# Patient Record
Sex: Female | Born: 1935 | Race: White | Hispanic: No | Smoking: Former smoker
Health system: Southern US, Community
[De-identification: ages and names within clinical notes are randomized; demographics above are authoritative.]

## PROBLEM LIST (undated history)

## (undated) DIAGNOSIS — S069X9A Unspecified intracranial injury with loss of consciousness of unspecified duration, initial encounter: Secondary | ICD-10-CM

## (undated) DIAGNOSIS — Z9289 Personal history of other medical treatment: Secondary | ICD-10-CM

## (undated) DIAGNOSIS — E7800 Pure hypercholesterolemia, unspecified: Secondary | ICD-10-CM

## (undated) DIAGNOSIS — F419 Anxiety disorder, unspecified: Secondary | ICD-10-CM

## (undated) DIAGNOSIS — E119 Type 2 diabetes mellitus without complications: Secondary | ICD-10-CM

## (undated) DIAGNOSIS — R011 Cardiac murmur, unspecified: Secondary | ICD-10-CM

## (undated) DIAGNOSIS — D649 Anemia, unspecified: Secondary | ICD-10-CM

## (undated) DIAGNOSIS — I10 Essential (primary) hypertension: Secondary | ICD-10-CM

## (undated) HISTORY — PX: TONSILLECTOMY: SUR1361

## (undated) HISTORY — PX: FRACTURE SURGERY: SHX138

## (undated) HISTORY — PX: APPENDECTOMY: SHX54

## (undated) HISTORY — PX: CHOLECYSTECTOMY: SHX55

## (undated) HISTORY — PX: TUBAL LIGATION: SHX77

---

## 2006-02-22 HISTORY — PX: HIP FRACTURE SURGERY: SHX118

## 2013-12-17 ENCOUNTER — Inpatient Hospital Stay (HOSPITAL_COMMUNITY): Payer: Medicare Other

## 2013-12-17 ENCOUNTER — Encounter (HOSPITAL_COMMUNITY): Payer: Self-pay

## 2013-12-17 ENCOUNTER — Emergency Department (HOSPITAL_COMMUNITY): Payer: Medicare Other

## 2013-12-17 ENCOUNTER — Inpatient Hospital Stay (HOSPITAL_COMMUNITY)
Admission: EM | Admit: 2013-12-17 | Discharge: 2013-12-23 | DRG: 958 | Disposition: A | Discharge status: Skilled Nursing Facility | Payer: Medicare Other | Attending: General Surgery | Admitting: General Surgery

## 2013-12-17 DIAGNOSIS — E119 Type 2 diabetes mellitus without complications: Secondary | ICD-10-CM | POA: Diagnosis present

## 2013-12-17 DIAGNOSIS — S32471A Displaced fracture of medial wall of right acetabulum, initial encounter for closed fracture: Secondary | ICD-10-CM | POA: Diagnosis present

## 2013-12-17 DIAGNOSIS — S2242XA Multiple fractures of ribs, left side, initial encounter for closed fracture: Secondary | ICD-10-CM | POA: Diagnosis present

## 2013-12-17 DIAGNOSIS — I10 Essential (primary) hypertension: Secondary | ICD-10-CM | POA: Diagnosis present

## 2013-12-17 DIAGNOSIS — S2239XA Fracture of one rib, unspecified side, initial encounter for closed fracture: Secondary | ICD-10-CM

## 2013-12-17 DIAGNOSIS — I350 Nonrheumatic aortic (valve) stenosis: Secondary | ICD-10-CM

## 2013-12-17 DIAGNOSIS — S069X9A Unspecified intracranial injury with loss of consciousness of unspecified duration, initial encounter: Secondary | ICD-10-CM | POA: Diagnosis present

## 2013-12-17 DIAGNOSIS — W2212XA Striking against or struck by front passenger side automobile airbag, initial encounter: Secondary | ICD-10-CM | POA: Diagnosis present

## 2013-12-17 DIAGNOSIS — S069XAA Unspecified intracranial injury with loss of consciousness status unknown, initial encounter: Secondary | ICD-10-CM | POA: Diagnosis present

## 2013-12-17 DIAGNOSIS — S32409A Unspecified fracture of unspecified acetabulum, initial encounter for closed fracture: Secondary | ICD-10-CM

## 2013-12-17 DIAGNOSIS — D62 Acute posthemorrhagic anemia: Secondary | ICD-10-CM | POA: Diagnosis not present

## 2013-12-17 DIAGNOSIS — S32401A Unspecified fracture of right acetabulum, initial encounter for closed fracture: Secondary | ICD-10-CM | POA: Diagnosis present

## 2013-12-17 DIAGNOSIS — E785 Hyperlipidemia, unspecified: Secondary | ICD-10-CM | POA: Diagnosis present

## 2013-12-17 DIAGNOSIS — Z7982 Long term (current) use of aspirin: Secondary | ICD-10-CM | POA: Diagnosis not present

## 2013-12-17 DIAGNOSIS — S066X9A Traumatic subarachnoid hemorrhage with loss of consciousness of unspecified duration, initial encounter: Secondary | ICD-10-CM | POA: Diagnosis present

## 2013-12-17 DIAGNOSIS — T1490 Injury, unspecified: Secondary | ICD-10-CM | POA: Diagnosis present

## 2013-12-17 DIAGNOSIS — T1490XA Injury, unspecified, initial encounter: Secondary | ICD-10-CM

## 2013-12-17 HISTORY — DX: Essential (primary) hypertension: I10

## 2013-12-17 HISTORY — DX: Unspecified intracranial injury with loss of consciousness status unknown, initial encounter: S06.9XAA

## 2013-12-17 HISTORY — DX: Unspecified intracranial injury with loss of consciousness of unspecified duration, initial encounter: S06.9X9A

## 2013-12-17 LAB — URINALYSIS, ROUTINE W REFLEX MICROSCOPIC
Bilirubin Urine: NEGATIVE
GLUCOSE, UA: NEGATIVE mg/dL
KETONES UR: 15 mg/dL — AB
NITRITE: NEGATIVE
PROTEIN: 30 mg/dL — AB
Specific Gravity, Urine: 1.044 — ABNORMAL HIGH (ref 1.005–1.030)
Urobilinogen, UA: 0.2 mg/dL (ref 0.0–1.0)
pH: 6.5 (ref 5.0–8.0)

## 2013-12-17 LAB — COMPREHENSIVE METABOLIC PANEL
ALBUMIN: 3.6 g/dL (ref 3.5–5.2)
ALT: 20 U/L (ref 0–35)
ANION GAP: 14 (ref 5–15)
AST: 29 U/L (ref 0–37)
Alkaline Phosphatase: 61 U/L (ref 39–117)
BILIRUBIN TOTAL: 0.3 mg/dL (ref 0.3–1.2)
BUN: 17 mg/dL (ref 6–23)
CHLORIDE: 101 meq/L (ref 96–112)
CO2: 24 mEq/L (ref 19–32)
Calcium: 9.6 mg/dL (ref 8.4–10.5)
Creatinine, Ser: 0.89 mg/dL (ref 0.50–1.10)
GFR calc Af Amer: 70 mL/min — ABNORMAL LOW (ref >90)
GFR calc non Af Amer: 60 mL/min — ABNORMAL LOW (ref >90)
Glucose, Bld: 155 mg/dL — ABNORMAL HIGH (ref 70–99)
POTASSIUM: 4.2 meq/L (ref 3.7–5.3)
Sodium: 139 mEq/L (ref 137–147)
TOTAL PROTEIN: 6.3 g/dL (ref 6.0–8.3)

## 2013-12-17 LAB — CBC
HEMATOCRIT: 30.8 % — AB (ref 36.0–46.0)
HEMOGLOBIN: 10.1 g/dL — AB (ref 12.0–15.0)
MCH: 32.4 pg (ref 26.0–34.0)
MCHC: 32.8 g/dL (ref 30.0–36.0)
MCV: 98.7 fL (ref 78.0–100.0)
Platelets: 182 10*3/uL (ref 150–400)
RBC: 3.12 MIL/uL — ABNORMAL LOW (ref 3.87–5.11)
RDW: 13.9 % (ref 11.5–15.5)
WBC: 8.2 10*3/uL (ref 4.0–10.5)

## 2013-12-17 LAB — I-STAT CHEM 8, ED
BUN: 16 mg/dL (ref 6–23)
CALCIUM ION: 1.23 mmol/L (ref 1.13–1.30)
Chloride: 103 mEq/L (ref 96–112)
Creatinine, Ser: 0.9 mg/dL (ref 0.50–1.10)
Glucose, Bld: 155 mg/dL — ABNORMAL HIGH (ref 70–99)
HEMATOCRIT: 30 % — AB (ref 36.0–46.0)
HEMOGLOBIN: 10.2 g/dL — AB (ref 12.0–15.0)
Potassium: 4 mEq/L (ref 3.7–5.3)
Sodium: 141 mEq/L (ref 137–147)
TCO2: 24 mmol/L (ref 0–100)

## 2013-12-17 LAB — HEMOGLOBIN AND HEMATOCRIT, BLOOD
HCT: 25.6 % — ABNORMAL LOW (ref 36.0–46.0)
Hemoglobin: 8.3 g/dL — ABNORMAL LOW (ref 12.0–15.0)

## 2013-12-17 LAB — GLUCOSE, CAPILLARY: Glucose-Capillary: 229 mg/dL — ABNORMAL HIGH (ref 70–99)

## 2013-12-17 LAB — URINE MICROSCOPIC-ADD ON

## 2013-12-17 LAB — I-STAT CG4 LACTIC ACID, ED: LACTIC ACID, VENOUS: 1.1 mmol/L (ref 0.5–2.2)

## 2013-12-17 LAB — PROTIME-INR
INR: 1.12 (ref 0.00–1.49)
PROTHROMBIN TIME: 14.6 s (ref 11.6–15.2)

## 2013-12-17 LAB — SAMPLE TO BLOOD BANK

## 2013-12-17 LAB — MRSA PCR SCREENING: MRSA BY PCR: NEGATIVE

## 2013-12-17 LAB — ETHANOL: Alcohol, Ethyl (B): <11 mg/dL (ref 0–11)

## 2013-12-17 MED ORDER — INSULIN ASPART 100 UNIT/ML ~~LOC~~ SOLN
0.0000 [IU] | Freq: Three times a day (TID) | SUBCUTANEOUS | Status: DC
  Administered 2013-12-18: 2 [IU] via SUBCUTANEOUS
  Administered 2013-12-19: 3 [IU] via SUBCUTANEOUS
  Administered 2013-12-19: 2 [IU] via SUBCUTANEOUS
  Administered 2013-12-20 (×2): 3 [IU] via SUBCUTANEOUS
  Administered 2013-12-20: 2 [IU] via SUBCUTANEOUS
  Administered 2013-12-21: 3 [IU] via SUBCUTANEOUS
  Administered 2013-12-21 – 2013-12-22 (×2): 2 [IU] via SUBCUTANEOUS

## 2013-12-17 MED ORDER — MORPHINE SULFATE 2 MG/ML IJ SOLN: 2.0000 mg | INTRAMUSCULAR | Status: DC | PRN

## 2013-12-17 MED ORDER — BACITRACIN ZINC 500 UNIT/GM EX OINT
TOPICAL_OINTMENT | Freq: Two times a day (BID) | CUTANEOUS | Status: DC
  Administered 2013-12-17 – 2013-12-19 (×5): via TOPICAL
  Administered 2013-12-20 (×2): 1 via TOPICAL
  Administered 2013-12-21 – 2013-12-23 (×5): 15.5556 via TOPICAL
  Filled 2013-12-17: qty 28.35
  Filled 2013-12-17: qty 15
  Filled 2013-12-17: qty 28.35

## 2013-12-17 MED ORDER — GABAPENTIN 300 MG PO CAPS
300.0000 mg | ORAL_CAPSULE | Freq: Every day | ORAL | Status: DC
  Administered 2013-12-17 – 2013-12-22 (×6): 300 mg via ORAL
  Filled 2013-12-17 (×9): qty 1

## 2013-12-17 MED ORDER — POLYETHYLENE GLYCOL 3350 17 G PO PACK
17.0000 g | PACK | Freq: Every day | ORAL | Status: DC
  Administered 2013-12-19 – 2013-12-23 (×5): 17 g via ORAL
  Filled 2013-12-17 (×7): qty 1

## 2013-12-17 MED ORDER — HYDROCODONE-ACETAMINOPHEN 5-325 MG PO TABS
1.0000 | ORAL_TABLET | ORAL | Status: DC | PRN
  Administered 2013-12-17: 1 via ORAL
  Administered 2013-12-18 – 2013-12-20 (×3): 2 via ORAL
  Filled 2013-12-17: qty 2
  Filled 2013-12-17: qty 1
  Filled 2013-12-17 (×3): qty 2

## 2013-12-17 MED ORDER — ONDANSETRON HCL 4 MG/2ML IJ SOLN
4.0000 mg | Freq: Four times a day (QID) | INTRAMUSCULAR | Status: DC | PRN
  Administered 2013-12-17: 4 mg via INTRAVENOUS
  Filled 2013-12-17: qty 2

## 2013-12-17 MED ORDER — LOSARTAN POTASSIUM 25 MG PO TABS
25.0000 mg | ORAL_TABLET | Freq: Two times a day (BID) | ORAL | Status: DC
  Administered 2013-12-17 – 2013-12-23 (×11): 25 mg via ORAL
  Filled 2013-12-17 (×15): qty 1

## 2013-12-17 MED ORDER — FENTANYL CITRATE 0.05 MG/ML IJ SOLN
50.0000 ug | Freq: Once | INTRAMUSCULAR | Status: AC
  Administered 2013-12-17: 50 ug via INTRAVENOUS
  Filled 2013-12-17: qty 2

## 2013-12-17 MED ORDER — TRAMADOL HCL 50 MG PO TABS
50.0000 mg | ORAL_TABLET | Freq: Four times a day (QID) | ORAL | Status: DC | PRN
  Administered 2013-12-17 – 2013-12-19 (×3): 100 mg via ORAL
  Filled 2013-12-17 (×3): qty 2

## 2013-12-17 MED ORDER — SIMVASTATIN 40 MG PO TABS
40.0000 mg | ORAL_TABLET | Freq: Every day | ORAL | Status: DC
  Administered 2013-12-19 – 2013-12-23 (×5): 40 mg via ORAL
  Filled 2013-12-17 (×6): qty 1

## 2013-12-17 MED ORDER — PANTOPRAZOLE SODIUM 40 MG PO TBEC
40.0000 mg | DELAYED_RELEASE_TABLET | Freq: Every day | ORAL | Status: DC
  Administered 2013-12-17 – 2013-12-19 (×2): 40 mg via ORAL
  Filled 2013-12-17 (×2): qty 1

## 2013-12-17 MED ORDER — ONDANSETRON HCL 4 MG PO TABS: 4.0000 mg | ORAL_TABLET | Freq: Four times a day (QID) | ORAL | Status: DC | PRN

## 2013-12-17 MED ORDER — SODIUM CHLORIDE 0.9 % IV BOLUS (SEPSIS)
500.0000 mL | Freq: Once | INTRAVENOUS | Status: AC
  Administered 2013-12-17: 500 mL via INTRAVENOUS

## 2013-12-17 MED ORDER — FENTANYL CITRATE 0.05 MG/ML IJ SOLN
25.0000 ug | INTRAMUSCULAR | Status: DC | PRN
  Administered 2013-12-17 – 2013-12-18 (×2): 50 ug via INTRAVENOUS
  Administered 2013-12-18: 25 ug via INTRAVENOUS
  Administered 2013-12-19 (×2): 50 ug via INTRAVENOUS
  Filled 2013-12-17 (×5): qty 2

## 2013-12-17 MED ORDER — PANTOPRAZOLE SODIUM 40 MG IV SOLR
40.0000 mg | Freq: Every day | INTRAVENOUS | Status: DC
  Administered 2013-12-18: 40 mg via INTRAVENOUS
  Filled 2013-12-17 (×3): qty 40

## 2013-12-17 MED ORDER — POTASSIUM CHLORIDE IN NACL 20-0.45 MEQ/L-% IV SOLN
INTRAVENOUS | Status: DC
  Administered 2013-12-17 – 2013-12-18 (×3): via INTRAVENOUS
  Filled 2013-12-17 (×3): qty 1000

## 2013-12-17 MED ORDER — IOHEXOL 300 MG/ML  SOLN
100.0000 mL | Freq: Once | INTRAMUSCULAR | Status: AC | PRN
  Administered 2013-12-17: 100 mL via INTRAVENOUS

## 2013-12-17 MED ORDER — DOCUSATE SODIUM 100 MG PO CAPS
100.0000 mg | ORAL_CAPSULE | Freq: Two times a day (BID) | ORAL | Status: DC
  Administered 2013-12-18 – 2013-12-23 (×10): 100 mg via ORAL
  Filled 2013-12-17 (×13): qty 1

## 2013-12-17 NOTE — ED Notes (Signed)
Warm blankets placed on patient 

## 2013-12-17 NOTE — ED Notes (Signed)
Particia NearingNikki K, RN cut patient's shirt and bra off; pt undressed, in gown, on monitor, continuous pulse oximetry and blood pressure cuff; warm blankets given
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
## 2013-12-17 NOTE — ED Notes (Signed)
Patient to ct at this time

## 2013-12-17 NOTE — ED Notes (Signed)
Patient placed on cardiac monitor.
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
## 2013-12-17 NOTE — ED Notes (Signed)
500 cc bolus started due to BP 96/60.

## 2013-12-17 NOTE — ED Notes (Signed)
Family at bedside. 

## 2013-12-17 NOTE — ED Notes (Signed)
Called bed flow for status of bed.  Bed request currently being processed.

## 2013-12-17 NOTE — ED Notes (Signed)
Pt given a bag lunch and a diet ginger ale
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
## 2013-12-17 NOTE — ED Notes (Signed)
Family updated as to patient's status per Chaplain, Rosalia Hammersay
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
## 2013-12-18 ENCOUNTER — Inpatient Hospital Stay (HOSPITAL_COMMUNITY): Payer: Medicare Other

## 2013-12-18 ENCOUNTER — Encounter (HOSPITAL_COMMUNITY): Payer: Medicare Other | Admitting: Anesthesiology

## 2013-12-18 ENCOUNTER — Inpatient Hospital Stay (HOSPITAL_COMMUNITY): Payer: Medicare Other | Admitting: Anesthesiology

## 2013-12-18 ENCOUNTER — Encounter (HOSPITAL_COMMUNITY): Admission: EM | Disposition: A | Discharge status: Skilled Nursing Facility | Payer: Self-pay | Source: Home / Self Care

## 2013-12-18 DIAGNOSIS — I359 Nonrheumatic aortic valve disorder, unspecified: Secondary | ICD-10-CM

## 2013-12-18 HISTORY — PX: HIP PINNING,CANNULATED: SHX1758

## 2013-12-18 LAB — GLUCOSE, CAPILLARY
GLUCOSE-CAPILLARY: 142 mg/dL — AB (ref 70–99)
GLUCOSE-CAPILLARY: 118 mg/dL — AB (ref 70–99)
Glucose-Capillary: 108 mg/dL — ABNORMAL HIGH (ref 70–99)
Glucose-Capillary: 113 mg/dL — ABNORMAL HIGH (ref 70–99)
Glucose-Capillary: 217 mg/dL — ABNORMAL HIGH (ref 70–99)

## 2013-12-18 LAB — PREPARE RBC (CROSSMATCH)

## 2013-12-18 LAB — BASIC METABOLIC PANEL
Anion gap: 10 (ref 5–15)
BUN: 23 mg/dL (ref 6–23)
CO2: 25 meq/L (ref 19–32)
Calcium: 8.4 mg/dL (ref 8.4–10.5)
Chloride: 105 mEq/L (ref 96–112)
Creatinine, Ser: 1.17 mg/dL — ABNORMAL HIGH (ref 0.50–1.10)
GFR calc Af Amer: 50 mL/min — ABNORMAL LOW (ref >90)
GFR calc non Af Amer: 43 mL/min — ABNORMAL LOW (ref >90)
GLUCOSE: 157 mg/dL — AB (ref 70–99)
POTASSIUM: 5.3 meq/L (ref 3.7–5.3)
SODIUM: 140 meq/L (ref 137–147)

## 2013-12-18 LAB — CBC
HCT: 21.8 % — ABNORMAL LOW (ref 36.0–46.0)
HCT: 31 % — ABNORMAL LOW (ref 36.0–46.0)
HEMOGLOBIN: 7 g/dL — AB (ref 12.0–15.0)
Hemoglobin: 10.5 g/dL — ABNORMAL LOW (ref 12.0–15.0)
MCH: 31.3 pg (ref 26.0–34.0)
MCH: 30.1 pg (ref 26.0–34.0)
MCHC: 32.1 g/dL (ref 30.0–36.0)
MCHC: 33.9 g/dL (ref 30.0–36.0)
MCV: 97.3 fL (ref 78.0–100.0)
MCV: 88.8 fL (ref 78.0–100.0)
PLATELETS: 112 10*3/uL — AB (ref 150–400)
Platelets: 154 10*3/uL (ref 150–400)
RBC: 2.24 MIL/uL — AB (ref 3.87–5.11)
RBC: 3.49 MIL/uL — ABNORMAL LOW (ref 3.87–5.11)
RDW: 14.2 % (ref 11.5–15.5)
RDW: 18.5 % — ABNORMAL HIGH (ref 11.5–15.5)
WBC: 6.6 10*3/uL (ref 4.0–10.5)
WBC: 8 10*3/uL (ref 4.0–10.5)

## 2013-12-18 LAB — ABO/RH: ABO/RH(D): O NEG

## 2013-12-18 SURGERY — FIXATION, FEMUR, NECK, PERCUTANEOUS, USING SCREW
Anesthesia: General | Laterality: Right

## 2013-12-18 MED ORDER — ONDANSETRON HCL 4 MG/2ML IJ SOLN
INTRAMUSCULAR | Status: DC | PRN
  Administered 2013-12-18: 4 mg via INTRAVENOUS

## 2013-12-18 MED ORDER — SODIUM CHLORIDE 0.9 % IV SOLN
INTRAVENOUS | Status: DC
Start: 2013-12-18 — End: 2013-12-20
  Administered 2013-12-18: 17:00:00 via INTRAVENOUS

## 2013-12-18 MED ORDER — LACTATED RINGERS IV SOLN
INTRAVENOUS | Status: DC | PRN
  Administered 2013-12-18: 13:00:00 via INTRAVENOUS

## 2013-12-18 MED ORDER — FENTANYL CITRATE 0.05 MG/ML IJ SOLN
INTRAMUSCULAR | Status: DC | PRN
  Administered 2013-12-18: 250 ug via INTRAVENOUS

## 2013-12-18 MED ORDER — CLINDAMYCIN PHOSPHATE 600 MG/50ML IV SOLN
600.0000 mg | INTRAVENOUS | Status: DC
  Filled 2013-12-18: qty 50

## 2013-12-18 MED ORDER — CLINDAMYCIN PHOSPHATE 600 MG/50ML IV SOLN
INTRAVENOUS | Status: DC | PRN
  Administered 2013-12-18: 600 mg via INTRAVENOUS

## 2013-12-18 MED ORDER — PROPOFOL 10 MG/ML IV BOLUS
INTRAVENOUS | Status: AC
  Filled 2013-12-18: qty 20

## 2013-12-18 MED ORDER — CEFAZOLIN SODIUM-DEXTROSE 2-3 GM-% IV SOLR
2.0000 g | Freq: Once | INTRAVENOUS | Status: AC
  Administered 2013-12-18: 2 g via INTRAVENOUS
  Filled 2013-12-18: qty 50

## 2013-12-18 MED ORDER — SODIUM CHLORIDE 0.9 % IV SOLN: Freq: Once | INTRAVENOUS | Status: DC

## 2013-12-18 MED ORDER — FENTANYL CITRATE 0.05 MG/ML IJ SOLN
INTRAMUSCULAR | Status: AC
  Filled 2013-12-18: qty 5

## 2013-12-18 MED ORDER — ROCURONIUM BROMIDE 100 MG/10ML IV SOLN
INTRAVENOUS | Status: DC | PRN
  Administered 2013-12-18: 40 mg via INTRAVENOUS

## 2013-12-18 MED ORDER — NEOSTIGMINE METHYLSULFATE 10 MG/10ML IV SOLN
INTRAVENOUS | Status: DC | PRN
  Administered 2013-12-18: 3 mg via INTRAVENOUS

## 2013-12-18 MED ORDER — PROPOFOL 10 MG/ML IV BOLUS
INTRAVENOUS | Status: DC | PRN
  Administered 2013-12-18: 80 mg via INTRAVENOUS

## 2013-12-18 MED ORDER — SODIUM CHLORIDE 0.9 % IV SOLN: 10.0000 mL/h | Freq: Once | INTRAVENOUS | Status: DC

## 2013-12-18 MED ORDER — LIDOCAINE HCL (CARDIAC) 20 MG/ML IV SOLN
INTRAVENOUS | Status: DC | PRN
  Administered 2013-12-18: 40 mg via INTRAVENOUS

## 2013-12-18 MED ORDER — GLYCOPYRROLATE 0.2 MG/ML IJ SOLN
INTRAMUSCULAR | Status: DC | PRN
  Administered 2013-12-18: .4 mg via INTRAVENOUS

## 2013-12-18 MED ORDER — EPHEDRINE SULFATE 50 MG/ML IJ SOLN
INTRAMUSCULAR | Status: DC | PRN
  Administered 2013-12-18 (×2): 10 mg via INTRAVENOUS

## 2013-12-18 MED ORDER — FENTANYL CITRATE 0.05 MG/ML IJ SOLN: 25.0000 ug | INTRAMUSCULAR | Status: DC | PRN

## 2013-12-18 SURGICAL SUPPLY — 36 items
BRUSH SCRUB DISP (MISCELLANEOUS) ×6 IMPLANT
COVER SURGICAL LIGHT HANDLE (MISCELLANEOUS) ×6 IMPLANT
DRAPE STERI IOBAN 125X83 (DRAPES) ×3 IMPLANT
DRSG MEPILEX BORDER 4X4 (GAUZE/BANDAGES/DRESSINGS) ×3 IMPLANT
ELECT REM PT RETURN 9FT ADLT (ELECTROSURGICAL) ×3
ELECTRODE REM PT RTRN 9FT ADLT (ELECTROSURGICAL) ×1 IMPLANT
GLOVE BIO SURGEON STRL SZ7.5 (GLOVE) ×3 IMPLANT
GLOVE BIO SURGEON STRL SZ8 (GLOVE) ×3 IMPLANT
GLOVE BIOGEL PI IND STRL 7.5 (GLOVE) ×1 IMPLANT
GLOVE BIOGEL PI IND STRL 8 (GLOVE) ×1 IMPLANT
GLOVE BIOGEL PI INDICATOR 7.5 (GLOVE) ×2
GLOVE BIOGEL PI INDICATOR 8 (GLOVE) ×2
GOWN STRL REUS W/ TWL LRG LVL3 (GOWN DISPOSABLE) ×2 IMPLANT
GOWN STRL REUS W/ TWL XL LVL3 (GOWN DISPOSABLE) ×1 IMPLANT
GOWN STRL REUS W/TWL LRG LVL3 (GOWN DISPOSABLE) ×4
GOWN STRL REUS W/TWL XL LVL3 (GOWN DISPOSABLE) ×2
KIT BASIN OR (CUSTOM PROCEDURE TRAY) ×3 IMPLANT
KIT ROOM TURNOVER OR (KITS) ×3 IMPLANT
MANIFOLD NEPTUNE II (INSTRUMENTS) IMPLANT
NS IRRIG 1000ML POUR BTL (IV SOLUTION) ×3 IMPLANT
PACK GENERAL/GYN (CUSTOM PROCEDURE TRAY) ×3 IMPLANT
PAD ARMBOARD 7.5X6 YLW CONV (MISCELLANEOUS) ×6 IMPLANT
SCREW CANN THRD 7.3X130 (Screw) ×3 IMPLANT
SPONGE GAUZE 4X4 12PLY STER LF (GAUZE/BANDAGES/DRESSINGS) ×3 IMPLANT
STAPLER VISISTAT 35W (STAPLE) ×3 IMPLANT
SUT ETHILON 3 0 PS 1 (SUTURE) ×3 IMPLANT
SUT VIC AB 0 CT1 27 (SUTURE) ×2
SUT VIC AB 0 CT1 27XBRD ANBCTR (SUTURE) ×1 IMPLANT
SUT VIC AB 1 CT1 27 (SUTURE) ×2
SUT VIC AB 1 CT1 27XBRD ANBCTR (SUTURE) ×1 IMPLANT
SUT VIC AB 2-0 CT1 27 (SUTURE) ×2
SUT VIC AB 2-0 CT1 TAPERPNT 27 (SUTURE) ×1 IMPLANT
TAPE CLOTH SURG 4X10 WHT LF (GAUZE/BANDAGES/DRESSINGS) ×3 IMPLANT
TOWEL OR 17X24 6PK STRL BLUE (TOWEL DISPOSABLE) ×3 IMPLANT
TOWEL OR 17X26 10 PK STRL BLUE (TOWEL DISPOSABLE) ×6 IMPLANT
WATER STERILE IRR 1000ML POUR (IV SOLUTION) ×3 IMPLANT

## 2013-12-18 NOTE — Progress Notes (Signed)
PT Cancellation Note  Patient Details Name: Isabella BowensMary Heyer MRN: [id number] DOB: [birth year]   Cancelled Treatment:    Reason Eval/Treat Not Completed: Patient at procedure or test/unavailable;Patient not medically ready (to OR today)   Fabio AsaWerner, Shalimar Mcclain J 12/18/2013, 10:55 AM Charlotte Crumbevon Markeith Jue, PT DPT  [phone]
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
## 2013-12-18 NOTE — Progress Notes (Signed)
Echo Lab  2D Echocardiogram completed.  Yesmin Mutch L Jury Caserta, RDCS 12/18/2013 10:37 AM \

## 2013-12-18 NOTE — Anesthesia Postprocedure Evaluation (Signed)
  Anesthesia Post-op Note  Patient: [name] BowensMary May  Procedure(s) Performed: Procedure(s) with comments: percutaneous repair right acetabular (Right) - Handy Bed, large carm, 7.3 cannulated screws OIC screw set  Patient Location: PACU  Anesthesia Type:General  Level of Consciousness: awake, alert , oriented and patient cooperative  Airway and Oxygen Therapy: Patient Spontanous Breathing and Patient connected to nasal cannula oxygen  Post-op Pain: none  Post-op Assessment: Post-op Vital signs reviewed, Patient's Cardiovascular Status Stable, Respiratory Function Stable, Patent Airway, No signs of Nausea or vomiting and Pain level controlled  Post-op Vital Signs: Reviewed and stable  Last Vitals:  Filed Vitals:   12/18/13 1700  BP: 130/69  Pulse: 78  Temp:   Resp: 17    Complications: No apparent anesthesia complications

## 2013-12-18 NOTE — Progress Notes (Signed)
Overall stable. Head CT scan unchanged. Patient going to OR today for hip fixation. No new recommendations. Avoid anticoagulation postop.

## 2013-12-18 NOTE — H&P (Signed)
Seen together. I also D/W her grandson. Patient examined and I agree with the assessment and plan  Violeta GelinasBurke Enolia Koepke, MD, MPH, FACS Trauma: [phone] General Surgery: [phone]  12/18/2013 6:31 AM

## 2013-12-18 NOTE — Progress Notes (Signed)
Care of pt assumed by MA Gamble Enderle RN 

## 2013-12-18 NOTE — Transfer of Care (Signed)
Immediate Anesthesia Transfer of Care Note  Patient: [name] BowensMary May  Procedure(s) Performed: Procedure(s) with comments: percutaneous repair right acetabular (Right) - Handy Bed, large carm, 7.3 cannulated screws OIC screw set  Patient Location: PACU  Anesthesia Type:General  Level of Consciousness: awake, alert  and oriented  Airway & Oxygen Therapy: Patient Spontanous Breathing  Post-op Assessment: Report given to PACU RN  Post vital signs: Reviewed and stable  Complications: No apparent anesthesia complications

## 2013-12-18 NOTE — Brief Op Note (Signed)
12/17/2013 - 12/18/2013  7:02 PM  PATIENT:  [name] May  80 y.o. female  PRE-OPERATIVE DIAGNOSIS:  right transverse acetabular fracture  POST-OPERATIVE DIAGNOSIS:  right transverse acetabular fracture  PROCEDURE:  Procedure(s) with comments: Repair right transverse acetabular fracture   SURGEON:  Surgeon(s) and Role:    * Budd PalmerMichael H Llesenia Fogal, MD - Primary  PHYSICIAN ASSISTANT: Montez MoritaKeith Paul, PA-C  ANESTHESIA:   general  I/O:     SPECIMEN:  Anearobic and anearobic from bone at ischium calcification  DISPOSITION OF SPECIMEN:  To micro  TOURNIQUET:  * No tourniquets in log *  DICTATION: .Other Dictation: Dictation Number [id number]

## 2013-12-18 NOTE — Progress Notes (Signed)
1st unit of PRBC's still infusing at this time.  Blood bank stated that there is a shortage on O neg blood hence why the blood took a little while to arrive.  CHG bath complete and pt transported to OR at this time via OR tech and pt's RN for ORIF of R acetabulum.  Family in waiting area for surgery.

## 2013-12-18 NOTE — Progress Notes (Signed)
OT Cancellation Note  Patient Details Name: [name] May MRN: [id number] DOB: [birth year]   Cancelled Treatment:    Reason Eval/Treat Not Completed: Patient not medically ready. To OR this AM for surgery.  Evette GeorgesLeonard, Lloyde Ludlam Eva [phone] 12/18/2013, 9:30 AM
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
## 2013-12-18 NOTE — Progress Notes (Signed)
UR completed.  Forest Pruden, RN BSN MHA CCM Trauma/Neuro ICU Case Manager [phone]  

## 2013-12-18 NOTE — Consult Note (Signed)
I have seen and examined the patient. I agree with the findings above.  Right transverse acetabular fracture No neurological deficits. Plan for closed reduction and percutaneous screw fixation.  I discussed with the patient and her grandson the risks and benefits of surgery, including the possibility of infection, nerve injury, vessel injury, wound breakdown, arthritis, symptomatic hardware, DVT/ PE, loss of motion, and need for further surgery among others.  They understood these risks and wished to proceed.   Budd PalmerHANDY,[name] Eccleston H, MD 12/18/2013 12:09 PM

## 2013-12-19 ENCOUNTER — Encounter (HOSPITAL_COMMUNITY): Payer: Self-pay | Admitting: Neurology

## 2013-12-19 LAB — POCT I-STAT 7, (LYTES, BLD GAS, ICA,H+H)
Bicarbonate: 23.1 mEq/L (ref 20.0–24.0)
Calcium, Ion: 1.15 mmol/L (ref 1.13–1.30)
HCT: 27 % — ABNORMAL LOW (ref 36.0–46.0)
HEMOGLOBIN: 9.2 g/dL — AB (ref 12.0–15.0)
O2 Saturation: 100 %
PH ART: 7.487 — AB (ref 7.350–7.450)
PO2 ART: 248 mmHg — AB (ref 80.0–100.0)
POTASSIUM: 4.6 meq/L (ref 3.7–5.3)
Patient temperature: 36
Sodium: 137 meq/L (ref 137–147)
TCO2: 24 mmol/L (ref 0–100)
pCO2 arterial: 30.3 mmHg — ABNORMAL LOW (ref 35.0–45.0)

## 2013-12-19 LAB — BASIC METABOLIC PANEL
ANION GAP: 10 (ref 5–15)
BUN: 19 mg/dL (ref 6–23)
CALCIUM: 8.1 mg/dL — AB (ref 8.4–10.5)
CO2: 25 mEq/L (ref 19–32)
Chloride: 104 mEq/L (ref 96–112)
Creatinine, Ser: 1.05 mg/dL (ref 0.50–1.10)
GFR, EST AFRICAN AMERICAN: 57 mL/min — AB (ref >90)
GFR, EST NON AFRICAN AMERICAN: 49 mL/min — AB (ref >90)
Glucose, Bld: 160 mg/dL — ABNORMAL HIGH (ref 70–99)
Potassium: 4.5 mEq/L (ref 3.7–5.3)
SODIUM: 139 meq/L (ref 137–147)

## 2013-12-19 LAB — TYPE AND SCREEN
ABO/RH(D): O NEG
ANTIBODY SCREEN: NEGATIVE
Unit division: 0
Unit division: 0
Unit division: 0

## 2013-12-19 LAB — CBC
HCT: 29.3 % — ABNORMAL LOW (ref 36.0–46.0)
Hemoglobin: 9.8 g/dL — ABNORMAL LOW (ref 12.0–15.0)
MCH: 29.2 pg (ref 26.0–34.0)
MCHC: 33.4 g/dL (ref 30.0–36.0)
MCV: 87.2 fL (ref 78.0–100.0)
Platelets: 103 10*3/uL — ABNORMAL LOW (ref 150–400)
RBC: 3.36 MIL/uL — ABNORMAL LOW (ref 3.87–5.11)
RDW: 19.5 % — ABNORMAL HIGH (ref 11.5–15.5)
WBC: 6.5 10*3/uL (ref 4.0–10.5)

## 2013-12-19 LAB — GLUCOSE, CAPILLARY
GLUCOSE-CAPILLARY: 132 mg/dL — AB (ref 70–99)
Glucose-Capillary: 160 mg/dL — ABNORMAL HIGH (ref 70–99)
Glucose-Capillary: 118 mg/dL — ABNORMAL HIGH (ref 70–99)
Glucose-Capillary: 161 mg/dL — ABNORMAL HIGH (ref 70–99)

## 2013-12-19 LAB — POCT I-STAT 4, (NA,K, GLUC, HGB,HCT)
Glucose, Bld: 123 mg/dL — ABNORMAL HIGH (ref 70–99)
HCT: 22 % — ABNORMAL LOW (ref 36.0–46.0)
Hemoglobin: 7.5 g/dL — ABNORMAL LOW (ref 12.0–15.0)
POTASSIUM: 4.7 meq/L (ref 3.7–5.3)
Sodium: 139 meq/L (ref 137–147)

## 2013-12-19 NOTE — Clinical Social Work Placement (Addendum)
Clinical Social Work Department CLINICAL SOCIAL WORK PLACEMENT NOTE 12/19/2013  Patient:  [name],Yong  Account Number:  [id number] Admit date:  12/17/2013  Clinical Social Worker:  Macario GoldsJESSE SCINTO, LCSW  Date/time:  12/19/2013 10:55 AM  Clinical Social Work is seeking post-discharge placement for this patient at the following level of care:   SKILLED NURSING   (*CSW will update this form in Epic as items are completed)   12/19/2013  [facility]family provided with [facility] Work's list of facilities offering this level of care within the geographic area requested by the patient (or if unable, by the patient's family).  12/19/2013  Patient/family informed of their freedom to choose among providers that offer the needed level of care, that participate in Medicare, Medicaid or managed care program needed by the patient, have an available bed and are willing to accept the patient.  12/19/2013  Patient/family informed of [facility]' ownership interest in [facility], as well as of the fact that they are under no obligation to receive care at this facility.  PASARR submitted to EDS on 12/19/2013 PASARR number received on 12/19/2013  FL2 transmitted to all facilities in geographic area requested by pt/family on  12/19/2013 FL2 transmitted to all facilities within larger geographic area on   Patient informed that his/her managed care company has contracts with or will negotiate with  certain facilities, including the following:     Patient/family informed of bed offers received:  12/21/2013 Marcelline Deist(Allessandra Bernardi, Theresia MajorsLCSWA) Patient chooses bed at  Physician recommends and patient chooses bed at    Patient to be transferred to  on   Patient to be transferred to facility by  Patient and family notified of transfer on  Name of family member notified:    The following physician request were entered in Epic:   Additional Comments:  Lily Kochermily Margia Wiesen, LCSWA  [phone]) Licensed Clinical Social Worker Orthopedics [phone]([id number]) and Surgical [phone]([id number])
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
## 2013-12-19 NOTE — Clinical Social Work Note (Signed)
Clinical Social Work Department BRIEF PSYCHOSOCIAL ASSESSMENT 12/19/2013  Patient:  [name] May, [name] May     Account Number:  [id number]     [place] date:  12/17/2013  Clinical Social Worker:  Myles Lipps  Date/Time:  12/19/2013 10:55 AM  Referred by:  RN  Date Referred:  12/19/2013 Referred for  SNF Placement   Other Referral:   Interview type:  Patient Other interview type:   No family currently at bedside    PSYCHOSOCIAL DATA Living Status:  ALONE Admitted from facility:   Level of care:   Primary support name:  Melonie [place]  [phone] Primary support relationship to patient:  FAMILY Degree of support available:   Adequate - visiting regularly per RN    CURRENT CONCERNS Current Concerns  Post-Acute Placement   Other Concerns:    SOCIAL WORK ASSESSMENT / PLAN Clinical Social Worker met with patient at bedside to offer support and discuss patient needs at discharge.  Patient states that she lives at home alone and her grandson and his family live close by.  Patient moved to [place] with her husband 6 years ago.  Patient states that her husband passed June 2015 and it has been a hard adjustment living alone, but her grandson has provided good support.  Patient would prefer to return home with home health but agreeable to initiate SNF search in [facility].  CSW requested to contact patient grandson over the phone, however patient refused at this time and states that she will update him upon his arrival to the hospital.  CSW to follow up with patient regarding available bed offers.    Clinical Social Worker inquired about current substance use.  Patient states that she has two brothers who are severe alcoholics and learned at a young age that it wasn't for her.  Patient does not drink any alcohol at this time. SBIRT complete.  No resources needed at this time and no additional concerns at discharge.  CSW remains available for support and to facilitate patient discharge needs  once medically ready.   Assessment/plan status:  Psychosocial Support/Ongoing Assessment of Needs Other assessment/ plan:   Information/referral to community resources:   Clinical Social Worker to provide patient and/or patient grandson with facility list once bed offers are available. SBIRT complete.    PATIENT'S/FAMILY'S RESPONSE TO PLAN OF CARE: Patient alert and oriented x3 at time of assessment, however per RN, patient has moments of confusion.  Patient was very clear and hesitant regarding potential SNF placement.  CSW encouraged patient to communicate with patient grandson regarding additional support needs at home.  Patient verbalized understanding of CSW role and appreciation for support.
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
## 2013-12-20 DIAGNOSIS — S2242XA Multiple fractures of ribs, left side, initial encounter for closed fracture: Secondary | ICD-10-CM | POA: Diagnosis present

## 2013-12-20 DIAGNOSIS — S32401A Unspecified fracture of right acetabulum, initial encounter for closed fracture: Secondary | ICD-10-CM | POA: Diagnosis present

## 2013-12-20 DIAGNOSIS — I350 Nonrheumatic aortic (valve) stenosis: Secondary | ICD-10-CM

## 2013-12-20 DIAGNOSIS — E785 Hyperlipidemia, unspecified: Secondary | ICD-10-CM | POA: Insufficient documentation

## 2013-12-20 DIAGNOSIS — D62 Acute posthemorrhagic anemia: Secondary | ICD-10-CM | POA: Diagnosis not present

## 2013-12-20 DIAGNOSIS — E119 Type 2 diabetes mellitus without complications: Secondary | ICD-10-CM | POA: Insufficient documentation

## 2013-12-20 LAB — BASIC METABOLIC PANEL
ANION GAP: 12 (ref 5–15)
BUN: 17 mg/dL (ref 6–23)
CHLORIDE: 101 meq/L (ref 96–112)
CO2: 25 mEq/L (ref 19–32)
Calcium: 8.5 mg/dL (ref 8.4–10.5)
Creatinine, Ser: 1.01 mg/dL (ref 0.50–1.10)
GFR calc Af Amer: 60 mL/min — ABNORMAL LOW (ref >90)
GFR, EST NON AFRICAN AMERICAN: 52 mL/min — AB (ref >90)
Glucose, Bld: 155 mg/dL — ABNORMAL HIGH (ref 70–99)
Potassium: 4.4 mEq/L (ref 3.7–5.3)
Sodium: 138 mEq/L (ref 137–147)

## 2013-12-20 LAB — CBC
HCT: 30.6 % — ABNORMAL LOW (ref 36.0–46.0)
Hemoglobin: 10.2 g/dL — ABNORMAL LOW (ref 12.0–15.0)
MCH: 29.6 pg (ref 26.0–34.0)
MCHC: 33.3 g/dL (ref 30.0–36.0)
MCV: 88.7 fL (ref 78.0–100.0)
PLATELETS: 115 10*3/uL — AB (ref 150–400)
RBC: 3.45 MIL/uL — ABNORMAL LOW (ref 3.87–5.11)
RDW: 17.8 % — AB (ref 11.5–15.5)
WBC: 7.2 10*3/uL (ref 4.0–10.5)

## 2013-12-20 LAB — GLUCOSE, CAPILLARY
Glucose-Capillary: 156 mg/dL — ABNORMAL HIGH (ref 70–99)
Glucose-Capillary: 140 mg/dL — ABNORMAL HIGH (ref 70–99)
Glucose-Capillary: 165 mg/dL — ABNORMAL HIGH (ref 70–99)
Glucose-Capillary: 107 mg/dL — ABNORMAL HIGH (ref 70–99)

## 2013-12-20 MED ORDER — TRAMADOL HCL 50 MG PO TABS
100.0000 mg | ORAL_TABLET | Freq: Four times a day (QID) | ORAL | Status: DC
  Administered 2013-12-20 – 2013-12-23 (×12): 100 mg via ORAL
  Filled 2013-12-20 (×12): qty 2

## 2013-12-20 MED ORDER — MORPHINE SULFATE 2 MG/ML IJ SOLN: 2.0000 mg | INTRAMUSCULAR | Status: DC | PRN

## 2013-12-20 MED ORDER — HYDROCODONE-ACETAMINOPHEN 5-325 MG PO TABS
0.5000 | ORAL_TABLET | ORAL | Status: DC | PRN
  Administered 2013-12-22: 1 via ORAL
  Filled 2013-12-20: qty 1

## 2013-12-20 MED ORDER — METFORMIN HCL 500 MG PO TABS
1000.0000 mg | ORAL_TABLET | Freq: Two times a day (BID) | ORAL | Status: DC
  Administered 2013-12-20 – 2013-12-23 (×6): 1000 mg via ORAL
  Filled 2013-12-20 (×11): qty 2
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
## 2013-12-20 NOTE — Progress Notes (Signed)
I also spoke with her about her AS. She claims to have had this for over 29yrs. She does not want further evaluation at this time. [place] Patient examined and I agree with the assessment and plan  Violeta GelinasBurke Hermen Mario, MD, MPH, FACS Trauma: [phone] General Surgery: [phone]  12/20/2013 10:09 AM

## 2013-12-20 NOTE — Progress Notes (Signed)
Pt tx to [facility] per MD order, pt VSS, pt tol well, pt verbalized understanding of tx, [facility], all questions answered, report called to receiving RN, updates given

## 2013-12-21 ENCOUNTER — Encounter (HOSPITAL_COMMUNITY): Payer: Self-pay | Admitting: Orthopedic Surgery

## 2013-12-21 LAB — GLUCOSE, CAPILLARY
GLUCOSE-CAPILLARY: 179 mg/dL — AB (ref 70–99)
Glucose-Capillary: 138 mg/dL — ABNORMAL HIGH (ref 70–99)
Glucose-Capillary: 169 mg/dL — ABNORMAL HIGH (ref 70–99)
Glucose-Capillary: 91 mg/dL (ref 70–99)

## 2013-12-21 MED ORDER — TRAMADOL HCL 50 MG PO TABS: 100.0000 mg | ORAL_TABLET | Freq: Four times a day (QID) | ORAL | Status: DC
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
## 2013-12-21 NOTE — Progress Notes (Signed)
Await SNF. Patient examined and I agree with the assessment and plan  Violeta GelinasBurke Lorrain Rivers, MD, MPH, FACS Trauma: [phone] General Surgery: [phone]  12/21/2013 9:09 AM

## 2013-12-21 NOTE — Discharge Instructions (Signed)
Ortho instructions  Touchdown Weightbearing Right leg No range of motion restrictions Dressing changes as needed R buttock

## 2013-12-21 NOTE — Discharge Summary (Signed)
Raylan Troiani, MD, MPH, FACS Trauma: [phone] General Surgery: [phone]  
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
## 2013-12-21 NOTE — Progress Notes (Signed)
UR completed.  Medicare IM (Important Message) delivered to patient today by me in anticipation of discharge.  Pt planned for SNF at d/c and CSW is managing that process.   Carlyle LipaMichelle Lanyia Jewel, RN BSN MHA CCM Trauma/Neuro ICU Case Manager [phone]
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
## 2013-12-22 LAB — TISSUE CULTURE
CULTURE: NO GROWTH
Gram Stain: NONE SEEN

## 2013-12-22 LAB — GLUCOSE, CAPILLARY
GLUCOSE-CAPILLARY: 136 mg/dL — AB (ref 70–99)
Glucose-Capillary: 145 mg/dL — ABNORMAL HIGH (ref 70–99)
Glucose-Capillary: 104 mg/dL — ABNORMAL HIGH (ref 70–99)
Glucose-Capillary: 100 mg/dL — ABNORMAL HIGH (ref 70–99)

## 2013-12-22 NOTE — Progress Notes (Signed)
Patient to go to SNF today.  This patient has been seen and I agree with the findings and treatment plan.  Marta LamasJames O. Gae BonWyatt, III, MD, FACS [phone] (pager) [phone] (direct pager) Trauma Surgeon

## 2013-12-22 NOTE — Clinical Social Work Note (Signed)
CSW spoke with Dickie Laoby, grandson, again and they are wanting to go [facility]- PA and MD advised- I am awaiting callback from SNF to coordinate the transfer for today. Will update thereafter-  Reece LevyJanet Benetta Maclaren, MSW, LCSWA [phone] weekend coverage

## 2013-12-22 NOTE — Progress Notes (Signed)
Spoke with Marylu LundJanet, CSW, and pt should be able to go to the SNF tomorrow via ambulance.  Grandson informed,will call him once pt leaving tomorrow and he will meet her there.
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
## 2013-12-22 NOTE — Clinical Documentation Improvement (Signed)
CSW contacted family to advise of dc and provided son, Dickie Laoby, with updated SNF bed offers- he is planning to discuss with family and call me back- I will then update PA as to their decision for dc today.  Reece LevyJanet Laritza Vokes, MSW, Theresia MajorsLCSWA [phone] weekend coverage

## 2013-12-22 NOTE — Progress Notes (Signed)
For SNF tomorrow.  Marta LamasJames O. Gae BonWyatt, III, MD, FACS [phone]--pager [phone]--office [facility]

## 2013-12-23 LAB — GLUCOSE, CAPILLARY
Glucose-Capillary: 120 mg/dL — ABNORMAL HIGH (ref 70–99)
Glucose-Capillary: 149 mg/dL — ABNORMAL HIGH (ref 70–99)

## 2013-12-23 LAB — ANAEROBIC CULTURE: Gram Stain: NONE SEEN

## 2013-12-23 NOTE — Progress Notes (Signed)
Saline locks x 2 removed per order. EMS here to transport patient to [facility]. Trina Aoarla Vani Gunner, RN

## 2013-12-23 NOTE — Progress Notes (Signed)
Report called to [facility] at [facility].  Spoke with Navistar International CorporationStephanie. Trina Aoarla Janijah Symons, RN
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
## 2013-12-23 NOTE — Progress Notes (Signed)
Pt d/c [facility].  Pt and her grandson, Selena BattenCody, are both agreeable to tx. Ambulance arrangements made.

## 2014-02-05 ENCOUNTER — Inpatient Hospital Stay (HOSPITAL_COMMUNITY)
Admission: AD | Admit: 2014-02-05 | Discharge: 2014-02-10 | DRG: 811 | Disposition: A | Discharge status: Home / Self Care | Payer: Medicare Other | Source: Other Acute Inpatient Hospital | Attending: Internal Medicine | Admitting: Internal Medicine

## 2014-02-05 ENCOUNTER — Inpatient Hospital Stay (HOSPITAL_COMMUNITY): Payer: Medicare Other

## 2014-02-05 ENCOUNTER — Encounter (HOSPITAL_COMMUNITY): Payer: Self-pay | Admitting: General Practice

## 2014-02-05 DIAGNOSIS — D62 Acute posthemorrhagic anemia: Principal | ICD-10-CM | POA: Diagnosis present

## 2014-02-05 DIAGNOSIS — K922 Gastrointestinal hemorrhage, unspecified: Secondary | ICD-10-CM | POA: Diagnosis not present

## 2014-02-05 DIAGNOSIS — D649 Anemia, unspecified: Secondary | ICD-10-CM | POA: Diagnosis present

## 2014-02-05 DIAGNOSIS — E43 Unspecified severe protein-calorie malnutrition: Secondary | ICD-10-CM | POA: Diagnosis not present

## 2014-02-05 DIAGNOSIS — I248 Other forms of acute ischemic heart disease: Secondary | ICD-10-CM | POA: Diagnosis not present

## 2014-02-05 DIAGNOSIS — I350 Nonrheumatic aortic (valve) stenosis: Secondary | ICD-10-CM

## 2014-02-05 DIAGNOSIS — Z87891 Personal history of nicotine dependence: Secondary | ICD-10-CM | POA: Diagnosis not present

## 2014-02-05 DIAGNOSIS — Z8711 Personal history of peptic ulcer disease: Secondary | ICD-10-CM | POA: Diagnosis not present

## 2014-02-05 DIAGNOSIS — Z885 Allergy status to narcotic agent status: Secondary | ICD-10-CM

## 2014-02-05 DIAGNOSIS — Z8719 Personal history of other diseases of the digestive system: Secondary | ICD-10-CM

## 2014-02-05 DIAGNOSIS — E119 Type 2 diabetes mellitus without complications: Secondary | ICD-10-CM

## 2014-02-05 DIAGNOSIS — S069XAA Unspecified intracranial injury with loss of consciousness status unknown, initial encounter: Secondary | ICD-10-CM | POA: Diagnosis present

## 2014-02-05 DIAGNOSIS — I10 Essential (primary) hypertension: Secondary | ICD-10-CM | POA: Diagnosis not present

## 2014-02-05 DIAGNOSIS — Z6822 Body mass index (BMI) 22.0-22.9, adult: Secondary | ICD-10-CM

## 2014-02-05 DIAGNOSIS — Z66 Do not resuscitate: Secondary | ICD-10-CM | POA: Diagnosis present

## 2014-02-05 DIAGNOSIS — N179 Acute kidney failure, unspecified: Secondary | ICD-10-CM | POA: Diagnosis not present

## 2014-02-05 DIAGNOSIS — E780 Pure hypercholesterolemia: Secondary | ICD-10-CM | POA: Diagnosis not present

## 2014-02-05 DIAGNOSIS — E872 Acidosis: Secondary | ICD-10-CM | POA: Diagnosis present

## 2014-02-05 DIAGNOSIS — F419 Anxiety disorder, unspecified: Secondary | ICD-10-CM | POA: Diagnosis present

## 2014-02-05 DIAGNOSIS — S069X9A Unspecified intracranial injury with loss of consciousness of unspecified duration, initial encounter: Secondary | ICD-10-CM | POA: Diagnosis present

## 2014-02-05 HISTORY — DX: Unspecified intracranial injury with loss of consciousness of unspecified duration, initial encounter: S06.9X9A

## 2014-02-05 HISTORY — DX: Pure hypercholesterolemia, unspecified: E78.00

## 2014-02-05 HISTORY — DX: Cardiac murmur, unspecified: R01.1

## 2014-02-05 HISTORY — DX: Personal history of other medical treatment: Z92.89

## 2014-02-05 HISTORY — DX: Anemia, unspecified: D64.9

## 2014-02-05 HISTORY — DX: Anxiety disorder, unspecified: F41.9

## 2014-02-05 HISTORY — DX: Type 2 diabetes mellitus without complications: E11.9

## 2014-02-05 LAB — PROTIME-INR
INR: 1.45 (ref 0.00–1.49)
Prothrombin Time: 17.8 seconds — ABNORMAL HIGH (ref 11.6–15.2)

## 2014-02-05 LAB — CBC WITH DIFFERENTIAL/PLATELET
BASOS PCT: 0 % (ref 0–1)
Basophils Absolute: 0.1 10*3/uL (ref 0.0–0.1)
EOS ABS: 0 10*3/uL (ref 0.0–0.7)
Eosinophils Relative: 0 % (ref 0–5)
HEMATOCRIT: 21.9 % — AB (ref 36.0–46.0)
HEMOGLOBIN: 7.3 g/dL — AB (ref 12.0–15.0)
Lymphocytes Relative: 12 % (ref 12–46)
Lymphs Abs: 2 10*3/uL (ref 0.7–4.0)
MCH: 30 pg (ref 26.0–34.0)
MCHC: 33.3 g/dL (ref 30.0–36.0)
MCV: 90.1 fL (ref 78.0–100.0)
MONO ABS: 1 10*3/uL (ref 0.1–1.0)
Monocytes Relative: 6 % (ref 3–12)
NEUTROS PCT: 82 % — AB (ref 43–77)
Neutro Abs: 13.8 10*3/uL — ABNORMAL HIGH (ref 1.7–7.7)
Platelets: 192 10*3/uL (ref 150–400)
RBC: 2.43 MIL/uL — ABNORMAL LOW (ref 3.87–5.11)
RDW: 19.1 % — ABNORMAL HIGH (ref 11.5–15.5)
WBC: 16.9 10*3/uL — ABNORMAL HIGH (ref 4.0–10.5)

## 2014-02-05 LAB — URINALYSIS, ROUTINE W REFLEX MICROSCOPIC
Bilirubin Urine: NEGATIVE
GLUCOSE, UA: NEGATIVE mg/dL
KETONES UR: NEGATIVE mg/dL
Nitrite: NEGATIVE
PROTEIN: NEGATIVE mg/dL
Specific Gravity, Urine: 1.015 (ref 1.005–1.030)
Urobilinogen, UA: 0.2 mg/dL (ref 0.0–1.0)
pH: 5 (ref 5.0–8.0)

## 2014-02-05 LAB — COMPREHENSIVE METABOLIC PANEL
ALK PHOS: 127 U/L — AB (ref 39–117)
ALT: 48 U/L — ABNORMAL HIGH (ref 0–35)
AST: 60 U/L — ABNORMAL HIGH (ref 0–37)
Albumin: 3.2 g/dL — ABNORMAL LOW (ref 3.5–5.2)
Anion gap: 21 — ABNORMAL HIGH (ref 5–15)
BUN: 58 mg/dL — ABNORMAL HIGH (ref 6–23)
CO2: 15 mEq/L — ABNORMAL LOW (ref 19–32)
Calcium: 8.2 mg/dL — ABNORMAL LOW (ref 8.4–10.5)
Chloride: 100 mEq/L (ref 96–112)
Creatinine, Ser: 1.21 mg/dL — ABNORMAL HIGH (ref 0.50–1.10)
GFR calc Af Amer: 48 mL/min — ABNORMAL LOW (ref >90)
GFR calc non Af Amer: 41 mL/min — ABNORMAL LOW (ref >90)
GLUCOSE: 167 mg/dL — AB (ref 70–99)
POTASSIUM: 5.3 meq/L (ref 3.7–5.3)
Sodium: 136 mEq/L — ABNORMAL LOW (ref 137–147)
Total Bilirubin: 0.6 mg/dL (ref 0.3–1.2)
Total Protein: 5.4 g/dL — ABNORMAL LOW (ref 6.0–8.3)

## 2014-02-05 LAB — TROPONIN I: TROPONIN I: 0.65 ng/mL — AB (ref <0.30)

## 2014-02-05 LAB — AMYLASE: AMYLASE: 40 U/L (ref 0–105)

## 2014-02-05 LAB — URINE MICROSCOPIC-ADD ON

## 2014-02-05 LAB — APTT

## 2014-02-05 LAB — GLUCOSE, CAPILLARY
GLUCOSE-CAPILLARY: 133 mg/dL — AB (ref 70–99)
Glucose-Capillary: 187 mg/dL — ABNORMAL HIGH (ref 70–99)

## 2014-02-05 LAB — MRSA PCR SCREENING: MRSA BY PCR: NEGATIVE

## 2014-02-05 LAB — LIPASE, BLOOD: Lipase: 19 U/L (ref 11–59)

## 2014-02-05 LAB — LACTIC ACID, PLASMA: Lactic Acid, Venous: 4.7 mmol/L — ABNORMAL HIGH (ref 0.5–2.2)

## 2014-02-05 MED ORDER — SODIUM CHLORIDE 0.9 % IV SOLN
INTRAVENOUS | Status: DC
  Administered 2014-02-05 – 2014-02-09 (×2): via INTRAVENOUS

## 2014-02-05 MED ORDER — ONDANSETRON HCL 4 MG/2ML IJ SOLN
4.0000 mg | Freq: Four times a day (QID) | INTRAMUSCULAR | Status: DC | PRN
  Administered 2014-02-08: 4 mg via INTRAVENOUS
  Filled 2014-02-05: qty 2

## 2014-02-05 MED ORDER — PANTOPRAZOLE SODIUM 40 MG IV SOLR
40.0000 mg | Freq: Two times a day (BID) | INTRAVENOUS | Status: DC
  Administered 2014-02-05 – 2014-02-07 (×5): 40 mg via INTRAVENOUS
  Filled 2014-02-05 (×7): qty 40

## 2014-02-05 MED ORDER — INSULIN ASPART 100 UNIT/ML ~~LOC~~ SOLN
0.0000 [IU] | SUBCUTANEOUS | Status: DC
  Administered 2014-02-05 – 2014-02-07 (×4): 1 [IU] via SUBCUTANEOUS

## 2014-02-05 MED ORDER — SODIUM CHLORIDE 0.9 % IV SOLN: 250.0000 mL | INTRAVENOUS | Status: DC | PRN
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
## 2014-02-05 NOTE — Progress Notes (Signed)
CRITICAL VALUE ALERT  Critical value received: troponin 0.65  Date of notification:  02/06/15  Time of notification:  1834  Critical value read back:Yes.    Nurse who received alert:  Ree Edmanrystal Manus, RN  MD notified (1st page):  Dr. Tyson AliasFeinstein present on unit at time of call  Time of first page:  1834  MD notified (2nd page):  Time of second page:  Responding MD:  Dr. Tyson AliasFeinstein   Time MD responded: [id number]

## 2014-02-06 DIAGNOSIS — E43 Unspecified severe protein-calorie malnutrition: Secondary | ICD-10-CM | POA: Insufficient documentation

## 2014-02-06 DIAGNOSIS — K922 Gastrointestinal hemorrhage, unspecified: Secondary | ICD-10-CM

## 2014-02-06 LAB — CBC
HCT: 21 % — ABNORMAL LOW (ref 36.0–46.0)
HCT: 25.2 % — ABNORMAL LOW (ref 36.0–46.0)
HEMATOCRIT: 18.4 % — AB (ref 36.0–46.0)
HEMATOCRIT: 24.7 % — AB (ref 36.0–46.0)
HEMOGLOBIN: 7.1 g/dL — AB (ref 12.0–15.0)
Hemoglobin: 6 g/dL — CL (ref 12.0–15.0)
Hemoglobin: 8.6 g/dL — ABNORMAL LOW (ref 12.0–15.0)
Hemoglobin: 8.1 g/dL — ABNORMAL LOW (ref 12.0–15.0)
MCH: 30 pg (ref 26.0–34.0)
MCH: 30.3 pg (ref 26.0–34.0)
MCH: 31.1 pg (ref 26.0–34.0)
MCH: 31.2 pg (ref 26.0–34.0)
MCHC: 32.6 g/dL (ref 30.0–36.0)
MCHC: 32.8 g/dL (ref 30.0–36.0)
MCHC: 33.8 g/dL (ref 30.0–36.0)
MCHC: 34.1 g/dL (ref 30.0–36.0)
MCV: 92.9 fL (ref 78.0–100.0)
MCV: 91.3 fL (ref 78.0–100.0)
MCV: 92.1 fL (ref 78.0–100.0)
MCV: 91.5 fL (ref 78.0–100.0)
Platelets: 100 10*3/uL — ABNORMAL LOW (ref 150–400)
Platelets: 165 10*3/uL (ref 150–400)
Platelets: 176 10*3/uL (ref 150–400)
Platelets: 199 10*3/uL (ref 150–400)
RBC: 1.98 MIL/uL — ABNORMAL LOW (ref 3.87–5.11)
RBC: 2.28 MIL/uL — ABNORMAL LOW (ref 3.87–5.11)
RBC: 2.7 MIL/uL — ABNORMAL LOW (ref 3.87–5.11)
RBC: 2.76 MIL/uL — AB (ref 3.87–5.11)
RDW: 19.6 % — AB (ref 11.5–15.5)
RDW: 20.7 % — AB (ref 11.5–15.5)
RDW: 20.8 % — AB (ref 11.5–15.5)
RDW: 21.3 % — AB (ref 11.5–15.5)
WBC: 8.9 10*3/uL (ref 4.0–10.5)
WBC: 10.2 10*3/uL (ref 4.0–10.5)
WBC: 12.8 10*3/uL — AB (ref 4.0–10.5)
WBC: 9.9 10*3/uL (ref 4.0–10.5)

## 2014-02-06 LAB — GLUCOSE, CAPILLARY
GLUCOSE-CAPILLARY: 110 mg/dL — AB (ref 70–99)
Glucose-Capillary: 104 mg/dL — ABNORMAL HIGH (ref 70–99)
Glucose-Capillary: 121 mg/dL — ABNORMAL HIGH (ref 70–99)
Glucose-Capillary: 126 mg/dL — ABNORMAL HIGH (ref 70–99)
Glucose-Capillary: 127 mg/dL — ABNORMAL HIGH (ref 70–99)
Glucose-Capillary: 68 mg/dL — ABNORMAL LOW (ref 70–99)
Glucose-Capillary: 92 mg/dL (ref 70–99)

## 2014-02-06 LAB — BASIC METABOLIC PANEL
Anion gap: 13 (ref 5–15)
BUN: 50 mg/dL — AB (ref 6–23)
CO2: 17 mEq/L — ABNORMAL LOW (ref 19–32)
Calcium: 6.8 mg/dL — ABNORMAL LOW (ref 8.4–10.5)
Chloride: 107 mEq/L (ref 96–112)
Creatinine, Ser: 1.07 mg/dL (ref 0.50–1.10)
GFR calc non Af Amer: 48 mL/min — ABNORMAL LOW (ref >90)
GFR, EST AFRICAN AMERICAN: 56 mL/min — AB (ref >90)
Glucose, Bld: 102 mg/dL — ABNORMAL HIGH (ref 70–99)
Potassium: 4.1 mEq/L (ref 3.7–5.3)
Sodium: 137 mEq/L (ref 137–147)

## 2014-02-06 LAB — MAGNESIUM: Magnesium: 1.5 mg/dL (ref 1.5–2.5)

## 2014-02-06 LAB — APTT: aPTT: 29 seconds (ref 24–37)

## 2014-02-06 LAB — PHOSPHORUS: Phosphorus: 4 mg/dL (ref 2.3–4.6)

## 2014-02-06 LAB — PREPARE RBC (CROSSMATCH)

## 2014-02-06 LAB — PROTIME-INR
INR: 1.4 (ref 0.00–1.49)
Prothrombin Time: 17.3 seconds — ABNORMAL HIGH (ref 11.6–15.2)

## 2014-02-06 MED ORDER — SODIUM CHLORIDE 0.9 % IV SOLN
INTRAVENOUS | Status: DC
  Administered 2014-02-06: 22:00:00 via INTRAVENOUS

## 2014-02-06 MED ORDER — DEXTROSE 50 % IV SOLN
INTRAVENOUS | Status: AC
  Administered 2014-02-06: 25 mL
  Filled 2014-02-06: qty 50

## 2014-02-06 MED ORDER — FENTANYL CITRATE 0.05 MG/ML IJ SOLN
25.0000 ug | INTRAMUSCULAR | Status: DC | PRN
  Administered 2014-02-06 – 2014-02-09 (×6): 50 ug via INTRAVENOUS
  Filled 2014-02-06 (×6): qty 2

## 2014-02-06 MED ORDER — DEXTROSE 50 % IV SOLN
25.0000 mL | Freq: Once | INTRAVENOUS | Status: AC
  Administered 2014-02-06: 25 mL via INTRAVENOUS

## 2014-02-06 MED ORDER — SODIUM CHLORIDE 0.9 % IV SOLN
Freq: Once | INTRAVENOUS | Status: AC
  Administered 2014-02-06: 12:00:00 via INTRAVENOUS

## 2014-02-06 MED ORDER — CETYLPYRIDINIUM CHLORIDE 0.05 % MT LIQD
7.0000 mL | Freq: Two times a day (BID) | OROMUCOSAL | Status: DC
  Administered 2014-02-06 – 2014-02-10 (×9): 7 mL via OROMUCOSAL
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
## 2014-02-06 NOTE — Progress Notes (Signed)
CRITICAL VALUE ALERT  Critical value received:  Hgb 6.0  Date of notification:  02/06/14  Time of notification:  0622  Critical value read back:Yes.    Nurse who received alert:  Fabio Neighborselso Ivanell Deshotel, RN  MD notified (1st page):  Koren BoundWesam Yacoub  Time of first page:  0626  MD notified (2nd page):  Time of second page:  Responding MD:  Koren BoundWesam Yacoub  Time MD responded:  0630

## 2014-02-06 NOTE — Progress Notes (Signed)
AT 2001, patient's CBG=68, implemented hypoglycemia protocol, administered 25mL 50% Dextrose Inj (0.5g/mL), rechecked at 2107, CBG=126.  Patient is NPO, alert, had dry mouth, and did oral care.  Elink MD paged.

## 2014-02-06 NOTE — Clinical Social Work Psychosocial (Signed)
Clinical Social Work Department BRIEF PSYCHOSOCIAL ASSESSMENT 02/06/2014  Patient:  [name] May,[name] May     Account Number:  [id number]     Admit date:  02/05/2014  Clinical Social Worker:  Merlyn LotHOLOMAN,Carolyn Sylvia, CLINICAL SOCIAL WORKER  Date/Time:  02/06/2014 10:00 AM  Referred by:  Physician  Date Referred:  02/05/2014 Referred for  SNF Placement   Other Referral:   Dr referral stated that patient is wanting to go home despite reduced mobility and supervision at home   Interview type:  Patient Other interview type:   Also spoke with Toby, patients grandson and HCPOA    PSYCHOSOCIAL DATA Living Status:  FAMILY Admitted from facility:   Level of care:   Primary support name:  Toby Primary support relationship to patient:  FAMILY Degree of support available:   Patient states that she has high level of support from grandson but he is not able to be at the house at all times- he works as a Theatre stage managerfire fighter- patient also states that she has many neighbors and friends who provide support when the grandson is not there    CURRENT CONCERNS Current Concerns  Post-Acute Placement   Other Concerns:    SOCIAL WORK ASSESSMENT / PLAN CSW spoke with patient conerning Dr. recommendation for SNF.  Patient [facility] and states that she does not want to go back to rehab after [facility]  Patient states that she was overworked in PT sessions and that staff was rude.    CSW and grandson spoke with patient concerning safety concerns at home- grandson believes that patient needs to go to SNF for best outcomes.  Patient is adamantly agains SNF despite grandson's concerns and would prefer to stay at home with home health following.    Lucila MaineGrandson is supportive of patient decision but reiterated that he woudl not hesitate to call 911 again if patient declines like she did before this previous admission.   Assessment/plan status:  Psychosocial Support/Ongoing  Assessment of Needs Other assessment/ plan:   none   Information/referral to community resources:   SNF    PATIENT'S/FAMILY'S RESPONSE TO PLAN OF CARE: Patient is not agreeable to Dr recommendation for SNF and believes she is well supervised at home and it would be safe to return to living with her grandson.       Merlyn LotJenna Holoman, LCSWA Clinical Social Worker [phone]

## 2014-02-07 ENCOUNTER — Encounter (HOSPITAL_COMMUNITY): Payer: Self-pay | Admitting: *Deleted

## 2014-02-07 ENCOUNTER — Encounter (HOSPITAL_COMMUNITY)
Admission: AD | Disposition: A | Discharge status: Home / Self Care | Payer: Self-pay | Source: Other Acute Inpatient Hospital | Attending: Internal Medicine

## 2014-02-07 DIAGNOSIS — K264 Chronic or unspecified duodenal ulcer with hemorrhage: Secondary | ICD-10-CM

## 2014-02-07 DIAGNOSIS — D62 Acute posthemorrhagic anemia: Principal | ICD-10-CM

## 2014-02-07 DIAGNOSIS — I350 Nonrheumatic aortic (valve) stenosis: Secondary | ICD-10-CM

## 2014-02-07 HISTORY — PX: ESOPHAGOGASTRODUODENOSCOPY: SHX5428

## 2014-02-07 LAB — GLUCOSE, CAPILLARY
Glucose-Capillary: 111 mg/dL — ABNORMAL HIGH (ref 70–99)
Glucose-Capillary: 117 mg/dL — ABNORMAL HIGH (ref 70–99)
Glucose-Capillary: 121 mg/dL — ABNORMAL HIGH (ref 70–99)
Glucose-Capillary: 217 mg/dL — ABNORMAL HIGH (ref 70–99)
Glucose-Capillary: 237 mg/dL — ABNORMAL HIGH (ref 70–99)
Glucose-Capillary: 90 mg/dL (ref 70–99)

## 2014-02-07 LAB — CBC
HEMATOCRIT: 26.6 % — AB (ref 36.0–46.0)
Hemoglobin: 8.9 g/dL — ABNORMAL LOW (ref 12.0–15.0)
MCH: 31.2 pg (ref 26.0–34.0)
MCHC: 33.5 g/dL (ref 30.0–36.0)
MCV: 93.3 fL (ref 78.0–100.0)
PLATELETS: 174 10*3/uL (ref 150–400)
RBC: 2.85 MIL/uL — ABNORMAL LOW (ref 3.87–5.11)
RDW: 21.1 % — AB (ref 11.5–15.5)
WBC: 9.5 10*3/uL (ref 4.0–10.5)

## 2014-02-07 LAB — BASIC METABOLIC PANEL WITH GFR
Anion gap: 19 — ABNORMAL HIGH (ref 5–15)
BUN: 40 mg/dL — ABNORMAL HIGH (ref 6–23)
CO2: 17 meq/L — ABNORMAL LOW (ref 19–32)
Calcium: 8.2 mg/dL — ABNORMAL LOW (ref 8.4–10.5)
Chloride: 104 meq/L (ref 96–112)
Creatinine, Ser: 0.87 mg/dL (ref 0.50–1.10)
GFR calc Af Amer: 72 mL/min — ABNORMAL LOW
GFR calc non Af Amer: 62 mL/min — ABNORMAL LOW
Glucose, Bld: 113 mg/dL — ABNORMAL HIGH (ref 70–99)
Potassium: 4 meq/L (ref 3.7–5.3)
Sodium: 140 meq/L (ref 137–147)

## 2014-02-07 LAB — TYPE AND SCREEN
ABO/RH(D): O NEG
Antibody Screen: NEGATIVE
UNIT DIVISION: 0

## 2014-02-07 SURGERY — EGD (ESOPHAGOGASTRODUODENOSCOPY)
Anesthesia: Moderate Sedation

## 2014-02-07 MED ORDER — BUTAMBEN-TETRACAINE-BENZOCAINE 2-2-14 % EX AERO
INHALATION_SPRAY | CUTANEOUS | Status: DC | PRN
  Administered 2014-02-07: 1 via TOPICAL

## 2014-02-07 MED ORDER — GLUCERNA SHAKE PO LIQD
237.0000 mL | Freq: Three times a day (TID) | ORAL | Status: DC
  Administered 2014-02-07 – 2014-02-09 (×7): 237 mL via ORAL

## 2014-02-07 MED ORDER — SIMVASTATIN 40 MG PO TABS
40.0000 mg | ORAL_TABLET | Freq: Every day | ORAL | Status: DC
  Administered 2014-02-07 – 2014-02-09 (×3): 40 mg via ORAL
  Filled 2014-02-07 (×4): qty 1

## 2014-02-07 MED ORDER — MIDAZOLAM HCL 5 MG/ML IJ SOLN
INTRAMUSCULAR | Status: AC
  Filled 2014-02-07: qty 2

## 2014-02-07 MED ORDER — SUCRALFATE 1 GM/10ML PO SUSP
1.0000 g | Freq: Three times a day (TID) | ORAL | Status: DC
  Administered 2014-02-07 – 2014-02-10 (×12): 1 g via ORAL
  Filled 2014-02-07 (×16): qty 10

## 2014-02-07 MED ORDER — INSULIN ASPART 100 UNIT/ML ~~LOC~~ SOLN
0.0000 [IU] | Freq: Three times a day (TID) | SUBCUTANEOUS | Status: DC
  Administered 2014-02-07: 3 [IU] via SUBCUTANEOUS
  Administered 2014-02-08: 5 [IU] via SUBCUTANEOUS
  Administered 2014-02-08: 2 [IU] via SUBCUTANEOUS
  Administered 2014-02-08: 3 [IU] via SUBCUTANEOUS
  Administered 2014-02-09: 2 [IU] via SUBCUTANEOUS
  Administered 2014-02-09: 1 [IU] via SUBCUTANEOUS
  Administered 2014-02-09: 2 [IU] via SUBCUTANEOUS
  Administered 2014-02-10: 1 [IU] via SUBCUTANEOUS

## 2014-02-07 MED ORDER — FENTANYL CITRATE 0.05 MG/ML IJ SOLN
INTRAMUSCULAR | Status: AC
  Filled 2014-02-07: qty 2

## 2014-02-07 MED ORDER — MIDAZOLAM HCL 10 MG/2ML IJ SOLN
INTRAMUSCULAR | Status: DC | PRN
Start: 2014-02-07 — End: 2014-02-07
  Administered 2014-02-07 (×3): .5 mg via INTRAVENOUS

## 2014-02-07 MED ORDER — GABAPENTIN 300 MG PO CAPS
300.0000 mg | ORAL_CAPSULE | Freq: Every day | ORAL | Status: DC
  Administered 2014-02-07 – 2014-02-09 (×3): 300 mg via ORAL
  Filled 2014-02-07 (×5): qty 1
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
## 2014-02-07 NOTE — Progress Notes (Signed)
Endoscopy shows large, deep duodenal ulcer.  I have ordered a full liquid diet for the patient.  Please see the dictated procedure report for more details concerning findings and recommendations.  I will sign off the patient at this time; please call us if further input from us would be helpful.  Florencia Reasonsobert V. Mone Commisso, M.D. [phone]

## 2014-02-07 NOTE — Op Note (Signed)
[facility] [address] [place], [id number]   ENDOSCOPY PROCEDURE REPORT  PATIENT: [name] May, [name] May  MR#: [id number] BIRTHDATE: [birth year] , 81  yrs. old GENDER: female ENDOSCOPIST:Austine Wiedeman, MD REFERRED BY: PROCEDURE DATE:  02/07/2014 PROCEDURE:   upper endoscopy with biopsies ASA CLASS:    IV INDICATIONS: presumed GI bleeding (recent reported bloody stools, severe anemia) in a patient who had been on a daily aspirin and using some Aleve prior to admission MEDICATION: Versed 1.5 mg IV TOPICAL ANESTHETIC:   Cetacaine spray  DESCRIPTION OF PROCEDURE:   After the risks and benefits of the procedure were explained, informed consent was obtained.  The Pentax Gastroscope Peds [id number]  endoscope was introduced through the mouth  and advanced to the second portion of the duodenum . The instrument was slowly withdrawn as the mucosa was fully examined.    [facility] as an [facility]. Consent provided, timeout performed, sedation well tolerated.  Pentax pediatric upper endoscope was passed under direct vision. Esophagus entered without significant difficulty. No esophageal abnormalities.  Stomach contained clear bilious residual, no blood or coffee-ground material. Mild to moderate antral erythema, but no erosions, ulcers, polyps, or masses.  Pylorus normal.  Duodenal bulb had large, 1.5 x 2.5 cm deep but clean-based ulcer. Second duodenum normal.         retroflexed view of the cardia and fundus unremarkable.  Antral biopsies obtained to check for Helicobacter pylori infection.  The scope was then withdrawn from the patient and the procedure completed.  COMPLICATIONS: There were no immediate complications.  ENDOSCOPIC IMPRESSION: 1. No active bleeding or blood in the stomach at the time of this procedure 2. Deep but clean-based duodenal ulcer, presumed source of patient's recent GI bleeding   RECOMMENDATIONS: 1.  Intensive antipeptic therapy. Will add sucralfate to her regimen. 2. Await pathology on biopsies. If Helicobacter pylori infection is present, I would probably treat it. However, the patient's antecedent use of aspirin and Aleve is probably the main cause for this ulcer 3. Accordingly, would emphasize avoidance of aspirin and nonsteroidal anti-inflammatory drugs in the future. If a strong indication for ongoing aspirin therapy is felt to be present (for example, in view of her aortic stenosis), I feel that aspirin could be reasonably restarted in 2-4 weeks, or sooner if critical to be on it. 4. While in the hospital, I would favor twice-daily PPI therapy plus sucralfate 4 times a day. After discharge, I think a proton pump inhibitor once daily would be sufficient, without need for ongoing sucralfate. I would treat the patient for 2 months, or forever if she is going to be going back on ulcerogenic medications such as aspirin. 5. The location and severity of this ulcer may lead to upper tract symptoms such as nausea, anorexia, or postprandial food intolerance. The patient may do better eating frequent small meals and using liquid supplements, such as an sure, which would more likely be easily emptied from the stomach. As her symptoms improve, her diet could be advanced. 6. Ordinarily, we do not do follow-up endoscopies to confirm healing in patients with duodenal ulcers (as opposed to gastric ulcers). However,  I would consider repeat endoscopy if she has persisting dyspeptic symptoms   _______________________________ eSigned:  Bernette Redbirdobert Deshante Cassell, MD 02/07/2014 12:01 PM     cc:  CPT CODES: ICD CODES:  The ICD and CPT codes recommended by this software are interpretations from the data that the clinical staff has captured with the  software.  The verification of the translation of this report to the ICD and CPT codes and modifiers is the sole responsibility of the health care  institution and practicing physician where this report was generated.  [employer] will not be held responsible for the validity of the ICD and CPT codes included on this report.  AMA assumes no liability for data contained or not contained herein. CPT is a Publishing rights managerregistered trademark of the [employer].  PATIENT NAME:  [name] May, [name] May MR#: [id number]
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
## 2014-02-08 ENCOUNTER — Encounter (HOSPITAL_COMMUNITY): Payer: Self-pay | Admitting: Gastroenterology

## 2014-02-08 LAB — BASIC METABOLIC PANEL
Anion gap: 11 (ref 5–15)
BUN: 26 mg/dL — ABNORMAL HIGH (ref 6–23)
CALCIUM: 7.8 mg/dL — AB (ref 8.4–10.5)
CO2: 21 meq/L (ref 19–32)
Chloride: 105 mEq/L (ref 96–112)
Creatinine, Ser: 0.65 mg/dL (ref 0.50–1.10)
GFR calc Af Amer: >90 mL/min (ref >90)
GFR calc non Af Amer: 82 mL/min — ABNORMAL LOW (ref >90)
GLUCOSE: 140 mg/dL — AB (ref 70–99)
Potassium: 4.1 mEq/L (ref 3.7–5.3)
Sodium: 137 mEq/L (ref 137–147)

## 2014-02-08 LAB — CBC
HEMATOCRIT: 25.4 % — AB (ref 36.0–46.0)
HEMOGLOBIN: 8.4 g/dL — AB (ref 12.0–15.0)
MCH: 30.5 pg (ref 26.0–34.0)
MCHC: 33.1 g/dL (ref 30.0–36.0)
MCV: 92.4 fL (ref 78.0–100.0)
Platelets: 159 10*3/uL (ref 150–400)
RBC: 2.75 MIL/uL — AB (ref 3.87–5.11)
RDW: 21.1 % — ABNORMAL HIGH (ref 11.5–15.5)
WBC: 8.1 10*3/uL (ref 4.0–10.5)

## 2014-02-08 LAB — GLUCOSE, CAPILLARY
GLUCOSE-CAPILLARY: 136 mg/dL — AB (ref 70–99)
GLUCOSE-CAPILLARY: 255 mg/dL — AB (ref 70–99)
GLUCOSE-CAPILLARY: 146 mg/dL — AB (ref 70–99)
Glucose-Capillary: 146 mg/dL — ABNORMAL HIGH (ref 70–99)
Glucose-Capillary: 158 mg/dL — ABNORMAL HIGH (ref 70–99)
Glucose-Capillary: 164 mg/dL — ABNORMAL HIGH (ref 70–99)
Glucose-Capillary: 209 mg/dL — ABNORMAL HIGH (ref 70–99)

## 2014-02-08 MED ORDER — PANTOPRAZOLE SODIUM 40 MG PO TBEC
40.0000 mg | DELAYED_RELEASE_TABLET | Freq: Two times a day (BID) | ORAL | Status: DC
  Administered 2014-02-08 – 2014-02-10 (×5): 40 mg via ORAL
  Filled 2014-02-08 (×5): qty 1
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
## 2014-02-09 LAB — GLUCOSE, CAPILLARY
GLUCOSE-CAPILLARY: 172 mg/dL — AB (ref 70–99)
Glucose-Capillary: 162 mg/dL — ABNORMAL HIGH (ref 70–99)
Glucose-Capillary: 155 mg/dL — ABNORMAL HIGH (ref 70–99)
Glucose-Capillary: 170 mg/dL — ABNORMAL HIGH (ref 70–99)
Glucose-Capillary: 120 mg/dL — ABNORMAL HIGH (ref 70–99)

## 2014-02-09 LAB — BASIC METABOLIC PANEL
Anion gap: 10 (ref 5–15)
BUN: 15 mg/dL (ref 6–23)
CALCIUM: 8.1 mg/dL — AB (ref 8.4–10.5)
CO2: 22 mEq/L (ref 19–32)
Chloride: 104 mEq/L (ref 96–112)
Creatinine, Ser: 0.61 mg/dL (ref 0.50–1.10)
GFR calc Af Amer: >90 mL/min (ref >90)
GFR, EST NON AFRICAN AMERICAN: 84 mL/min — AB (ref >90)
Glucose, Bld: 154 mg/dL — ABNORMAL HIGH (ref 70–99)
Potassium: 4.6 mEq/L (ref 3.7–5.3)
SODIUM: 136 meq/L — AB (ref 137–147)

## 2014-02-09 LAB — CBC
HCT: 26.5 % — ABNORMAL LOW (ref 36.0–46.0)
HEMOGLOBIN: 8.9 g/dL — AB (ref 12.0–15.0)
MCH: 30.9 pg (ref 26.0–34.0)
MCHC: 33.6 g/dL (ref 30.0–36.0)
MCV: 92 fL (ref 78.0–100.0)
Platelets: 171 10*3/uL (ref 150–400)
RBC: 2.88 MIL/uL — ABNORMAL LOW (ref 3.87–5.11)
RDW: 20.2 % — ABNORMAL HIGH (ref 11.5–15.5)
WBC: 7.2 10*3/uL (ref 4.0–10.5)

## 2014-02-09 MED ORDER — SUCRALFATE 1 GM/10ML PO SUSP: 1.0000 g | Freq: Three times a day (TID) | ORAL | Status: AC

## 2014-02-09 MED ORDER — PANTOPRAZOLE SODIUM 40 MG PO TBEC: 40.0000 mg | DELAYED_RELEASE_TABLET | Freq: Every day | ORAL | Status: AC

## 2014-02-09 MED ORDER — TRAMADOL HCL 50 MG PO TABS
100.0000 mg | ORAL_TABLET | Freq: Four times a day (QID) | ORAL | Status: DC | PRN
Start: 2014-02-09 — End: 2014-02-10
  Administered 2014-02-09 – 2014-02-10 (×3): 100 mg via ORAL
  Filled 2014-02-09 (×3): qty 2

## 2014-02-09 MED ORDER — TRAMADOL HCL 50 MG PO TABS: 100.0000 mg | ORAL_TABLET | Freq: Four times a day (QID) | ORAL | Status: AC | PRN

## 2014-02-09 NOTE — Discharge Instructions (Signed)
DO NOT TAKE IBUPROFEN, NAPROXEN, ALLEVE, MOTRIN, ADVIL ONLY TAKE TYLENOL OR TRAMADOL FOR PAIN   Peptic Ulcer A peptic ulcer is a sore in the lining of your esophagus (esophageal ulcer), stomach (gastric ulcer), or in the first part of your small intestine (duodenal ulcer). The ulcer causes erosion into the deeper tissue. CAUSES  Normally, the lining of the stomach and the small intestine protects itself from the acid that digests food. The protective lining can be damaged by:  An infection caused by a bacterium called Helicobacter pylori (H. pylori).  Regular use of nonsteroidal anti-inflammatory drugs (NSAIDs), such as ibuprofen or aspirin.  Smoking tobacco. Other risk factors include being older than 50, drinking alcohol excessively, and having a family history of ulcer disease.  SYMPTOMS   Burning pain or gnawing in the area between the chest and the belly button.  Heartburn.  Nausea and vomiting.  Bloating. The pain can be worse on an empty stomach and at night. If the ulcer results in bleeding, it can cause:  Black, tarry stools.  Vomiting of bright red blood.  Vomiting of coffee-ground-looking materials. DIAGNOSIS  A diagnosis is usually made based upon your history and an exam. Other tests and procedures may be performed to find the cause of the ulcer. Finding a cause will help determine the best treatment. Tests and procedures may include:  Blood tests, stool tests, or breath tests to check for the bacterium H. pylori.  An upper gastrointestinal (GI) series of the esophagus, stomach, and small intestine.  An endoscopy to examine the esophagus, stomach, and small intestine.  A biopsy. TREATMENT  Treatment may include:  Eliminating the cause of the ulcer, such as smoking, NSAIDs, or alcohol.  Medicines to reduce the amount of acid in your digestive tract.  Antibiotic medicines if the ulcer is caused by the H. pylori bacterium.  An upper endoscopy to treat a  bleeding ulcer.  Surgery if the bleeding is severe or if the ulcer created a hole somewhere in the digestive system. HOME CARE INSTRUCTIONS   Avoid tobacco, alcohol, and caffeine. Smoking can increase the acid in the stomach, and continued smoking will impair the healing of ulcers.  Avoid foods and drinks that seem to cause discomfort or aggravate your ulcer.  Only take medicines as directed by your caregiver. Do not substitute over-the-counter medicines for prescription medicines without talking to your caregiver.  Keep any follow-up appointments and tests as directed. SEEK MEDICAL CARE IF:   Your do not improve within 7 days of starting treatment.  You have ongoing indigestion or heartburn. SEEK IMMEDIATE MEDICAL CARE IF:   You have sudden, sharp, or persistent abdominal pain.  You have bloody or dark black, tarry stools.  You vomit blood or vomit that looks like coffee grounds.  You become light-headed, weak, or feel faint.  You become sweaty or clammy. MAKE SURE YOU:   Understand these instructions.  Will watch your condition.  Will get help right away if you are not doing well or get worse. Document Released: 02/06/2000 Document Revised: 06/25/2013 Document Reviewed: 09/08/2011 [facility] Patient Information 2015 [employer], [place]. This information is not intended to replace advice given to you by your health care provider. Make sure you discuss any questions you have with your health care provider.
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
## 2014-02-10 LAB — CBC
HCT: 29.8 % — ABNORMAL LOW (ref 36.0–46.0)
Hemoglobin: 9.5 g/dL — ABNORMAL LOW (ref 12.0–15.0)
MCH: 30 pg (ref 26.0–34.0)
MCHC: 31.9 g/dL (ref 30.0–36.0)
MCV: 94 fL (ref 78.0–100.0)
PLATELETS: 181 10*3/uL (ref 150–400)
RBC: 3.17 MIL/uL — AB (ref 3.87–5.11)
RDW: 20.1 % — ABNORMAL HIGH (ref 11.5–15.5)
WBC: 7.2 10*3/uL (ref 4.0–10.5)

## 2014-02-10 LAB — GLUCOSE, CAPILLARY: GLUCOSE-CAPILLARY: 126 mg/dL — AB (ref 70–99)

## 2014-02-10 NOTE — Progress Notes (Signed)
Pt is ready for discharge when grandson arrives.  DC instructions given and explained.  Rx given and explained.  Pt will continue with HHPT, home services as before and set up by CM.
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
## 2014-03-09 ENCOUNTER — Emergency Department (HOSPITAL_COMMUNITY): Payer: Medicare Other

## 2014-03-09 ENCOUNTER — Inpatient Hospital Stay (HOSPITAL_COMMUNITY)
Admission: EM | Admit: 2014-03-09 | Discharge: 2014-03-14 | DRG: 280 | Disposition: A | Discharge status: Skilled Nursing Facility | Payer: Medicare Other | Attending: Internal Medicine | Admitting: Internal Medicine

## 2014-03-09 DIAGNOSIS — D649 Anemia, unspecified: Secondary | ICD-10-CM | POA: Diagnosis present

## 2014-03-09 DIAGNOSIS — R778 Other specified abnormalities of plasma proteins: Secondary | ICD-10-CM

## 2014-03-09 DIAGNOSIS — E43 Unspecified severe protein-calorie malnutrition: Secondary | ICD-10-CM | POA: Diagnosis present

## 2014-03-09 DIAGNOSIS — F419 Anxiety disorder, unspecified: Secondary | ICD-10-CM | POA: Diagnosis present

## 2014-03-09 DIAGNOSIS — R7989 Other specified abnormal findings of blood chemistry: Secondary | ICD-10-CM | POA: Diagnosis present

## 2014-03-09 DIAGNOSIS — Z6821 Body mass index (BMI) 21.0-21.9, adult: Secondary | ICD-10-CM | POA: Diagnosis not present

## 2014-03-09 DIAGNOSIS — I214 Non-ST elevation (NSTEMI) myocardial infarction: Secondary | ICD-10-CM | POA: Diagnosis present

## 2014-03-09 DIAGNOSIS — I350 Nonrheumatic aortic (valve) stenosis: Secondary | ICD-10-CM | POA: Diagnosis present

## 2014-03-09 DIAGNOSIS — Z8719 Personal history of other diseases of the digestive system: Secondary | ICD-10-CM

## 2014-03-09 DIAGNOSIS — S069X9A Unspecified intracranial injury with loss of consciousness of unspecified duration, initial encounter: Secondary | ICD-10-CM | POA: Diagnosis present

## 2014-03-09 DIAGNOSIS — K5900 Constipation, unspecified: Secondary | ICD-10-CM | POA: Diagnosis not present

## 2014-03-09 DIAGNOSIS — E780 Pure hypercholesterolemia: Secondary | ICD-10-CM | POA: Diagnosis present

## 2014-03-09 DIAGNOSIS — R531 Weakness: Secondary | ICD-10-CM

## 2014-03-09 DIAGNOSIS — R410 Disorientation, unspecified: Secondary | ICD-10-CM | POA: Diagnosis present

## 2014-03-09 DIAGNOSIS — Z9049 Acquired absence of other specified parts of digestive tract: Secondary | ICD-10-CM | POA: Diagnosis present

## 2014-03-09 DIAGNOSIS — M25551 Pain in right hip: Secondary | ICD-10-CM

## 2014-03-09 DIAGNOSIS — I441 Atrioventricular block, second degree: Secondary | ICD-10-CM | POA: Diagnosis present

## 2014-03-09 DIAGNOSIS — Z87891 Personal history of nicotine dependence: Secondary | ICD-10-CM | POA: Diagnosis not present

## 2014-03-09 DIAGNOSIS — I10 Essential (primary) hypertension: Secondary | ICD-10-CM | POA: Diagnosis present

## 2014-03-09 DIAGNOSIS — E785 Hyperlipidemia, unspecified: Secondary | ICD-10-CM | POA: Diagnosis present

## 2014-03-09 DIAGNOSIS — S069XAA Unspecified intracranial injury with loss of consciousness status unknown, initial encounter: Secondary | ICD-10-CM | POA: Diagnosis present

## 2014-03-09 DIAGNOSIS — W19XXXA Unspecified fall, initial encounter: Secondary | ICD-10-CM | POA: Diagnosis present

## 2014-03-09 DIAGNOSIS — R52 Pain, unspecified: Secondary | ICD-10-CM

## 2014-03-09 DIAGNOSIS — K219 Gastro-esophageal reflux disease without esophagitis: Secondary | ICD-10-CM | POA: Diagnosis present

## 2014-03-09 DIAGNOSIS — Z87820 Personal history of traumatic brain injury: Secondary | ICD-10-CM

## 2014-03-09 DIAGNOSIS — Z515 Encounter for palliative care: Secondary | ICD-10-CM

## 2014-03-09 DIAGNOSIS — R4182 Altered mental status, unspecified: Secondary | ICD-10-CM

## 2014-03-09 DIAGNOSIS — Z66 Do not resuscitate: Secondary | ICD-10-CM | POA: Diagnosis present

## 2014-03-09 DIAGNOSIS — Z955 Presence of coronary angioplasty implant and graft: Secondary | ICD-10-CM | POA: Diagnosis not present

## 2014-03-09 DIAGNOSIS — E119 Type 2 diabetes mellitus without complications: Secondary | ICD-10-CM | POA: Diagnosis present

## 2014-03-09 DIAGNOSIS — M25451 Effusion, right hip: Secondary | ICD-10-CM | POA: Diagnosis present

## 2014-03-09 DIAGNOSIS — I442 Atrioventricular block, complete: Secondary | ICD-10-CM

## 2014-03-09 LAB — CBC WITH DIFFERENTIAL/PLATELET
BASOS PCT: 0 % (ref 0–1)
Basophils Absolute: 0 10*3/uL (ref 0.0–0.1)
Eosinophils Absolute: 0.1 10*3/uL (ref 0.0–0.7)
Eosinophils Relative: 2 % (ref 0–5)
HEMATOCRIT: 33 % — AB (ref 36.0–46.0)
Hemoglobin: 10.8 g/dL — ABNORMAL LOW (ref 12.0–15.0)
LYMPHS ABS: 1.4 10*3/uL (ref 0.7–4.0)
Lymphocytes Relative: 20 % (ref 12–46)
MCH: 30.3 pg (ref 26.0–34.0)
MCHC: 32.7 g/dL (ref 30.0–36.0)
MCV: 92.7 fL (ref 78.0–100.0)
MONO ABS: 0.5 10*3/uL (ref 0.1–1.0)
MONOS PCT: 7 % (ref 3–12)
NEUTROS ABS: 5.1 10*3/uL (ref 1.7–7.7)
NEUTROS PCT: 71 % (ref 43–77)
Platelets: 212 10*3/uL (ref 150–400)
RBC: 3.56 MIL/uL — ABNORMAL LOW (ref 3.87–5.11)
RDW: 16.4 % — ABNORMAL HIGH (ref 11.5–15.5)
WBC: 7.1 10*3/uL (ref 4.0–10.5)

## 2014-03-09 LAB — COMPREHENSIVE METABOLIC PANEL
ALBUMIN: 3.2 g/dL — AB (ref 3.5–5.2)
ALT: 10 U/L (ref 0–35)
AST: 20 U/L (ref 0–37)
Alkaline Phosphatase: 105 U/L (ref 39–117)
Anion gap: 9 (ref 5–15)
BILIRUBIN TOTAL: 0.6 mg/dL (ref 0.3–1.2)
BUN: 15 mg/dL (ref 6–23)
CALCIUM: 8.5 mg/dL (ref 8.4–10.5)
CO2: 27 mmol/L (ref 19–32)
Chloride: 105 mEq/L (ref 96–112)
Creatinine, Ser: 0.88 mg/dL (ref 0.50–1.10)
GFR calc Af Amer: 70 mL/min — ABNORMAL LOW (ref >90)
GFR calc non Af Amer: 60 mL/min — ABNORMAL LOW (ref >90)
Glucose, Bld: 152 mg/dL — ABNORMAL HIGH (ref 70–99)
Potassium: 4.2 mmol/L (ref 3.5–5.1)
Sodium: 141 mmol/L (ref 135–145)
TOTAL PROTEIN: 5.7 g/dL — AB (ref 6.0–8.3)

## 2014-03-09 LAB — URINALYSIS, ROUTINE W REFLEX MICROSCOPIC
BILIRUBIN URINE: NEGATIVE
GLUCOSE, UA: NEGATIVE mg/dL
Hgb urine dipstick: NEGATIVE
Ketones, ur: NEGATIVE mg/dL
Leukocytes, UA: NEGATIVE
Nitrite: NEGATIVE
PH: 7.5 (ref 5.0–8.0)
Protein, ur: NEGATIVE mg/dL
SPECIFIC GRAVITY, URINE: 1.013 (ref 1.005–1.030)
Urobilinogen, UA: 0.2 mg/dL (ref 0.0–1.0)

## 2014-03-09 LAB — PROTIME-INR
INR: 1.26 (ref 0.00–1.49)
Prothrombin Time: 15.9 seconds — ABNORMAL HIGH (ref 11.6–15.2)

## 2014-03-09 LAB — TROPONIN I: Troponin I: 0.19 ng/mL — ABNORMAL HIGH (ref <0.031)

## 2014-03-09 MED ORDER — SODIUM CHLORIDE 0.9 % IV SOLN
INTRAVENOUS | Status: DC
  Administered 2014-03-09 – 2014-03-10 (×2): via INTRAVENOUS

## 2014-03-09 MED ORDER — DICLOFENAC SODIUM 1 % TD GEL
4.0000 g | Freq: Two times a day (BID) | TRANSDERMAL | Status: DC
  Administered 2014-03-09 – 2014-03-14 (×10): 4 g via TOPICAL
  Filled 2014-03-09 (×2): qty 100

## 2014-03-09 MED ORDER — ACETAMINOPHEN 500 MG PO TABS
500.0000 mg | ORAL_TABLET | Freq: Four times a day (QID) | ORAL | Status: DC | PRN
Start: 2014-03-09 — End: 2014-03-14
  Administered 2014-03-11 – 2014-03-13 (×3): 500 mg via ORAL
  Filled 2014-03-09 (×3): qty 1

## 2014-03-09 MED ORDER — SODIUM CHLORIDE 0.9 % IJ SOLN
3.0000 mL | Freq: Two times a day (BID) | INTRAMUSCULAR | Status: DC
  Administered 2014-03-09 – 2014-03-14 (×10): 3 mL via INTRAVENOUS

## 2014-03-09 MED ORDER — LOSARTAN POTASSIUM 50 MG PO TABS
50.0000 mg | ORAL_TABLET | Freq: Every day | ORAL | Status: DC
  Administered 2014-03-10 – 2014-03-14 (×5): 50 mg via ORAL
  Filled 2014-03-09 (×5): qty 1

## 2014-03-09 MED ORDER — PANTOPRAZOLE SODIUM 40 MG PO TBEC
40.0000 mg | DELAYED_RELEASE_TABLET | Freq: Every day | ORAL | Status: DC
  Administered 2014-03-10 – 2014-03-14 (×5): 40 mg via ORAL
  Filled 2014-03-09 (×5): qty 1

## 2014-03-09 MED ORDER — QUETIAPINE FUMARATE 25 MG PO TABS
25.0000 mg | ORAL_TABLET | Freq: Every day | ORAL | Status: DC
  Administered 2014-03-09 – 2014-03-13 (×5): 25 mg via ORAL
  Filled 2014-03-09 (×7): qty 1

## 2014-03-09 MED ORDER — NITROGLYCERIN 0.1 MG/HR TD PT24
0.1000 mg | MEDICATED_PATCH | Freq: Every day | TRANSDERMAL | Status: DC | PRN
  Filled 2014-03-09: qty 1

## 2014-03-09 MED ORDER — HALOPERIDOL 1 MG PO TABS
1.0000 mg | ORAL_TABLET | Freq: Four times a day (QID) | ORAL | Status: DC | PRN
  Administered 2014-03-10: 1 mg via ORAL
  Filled 2014-03-09 (×3): qty 1

## 2014-03-09 MED ORDER — SIMVASTATIN 40 MG PO TABS
40.0000 mg | ORAL_TABLET | Freq: Every day | ORAL | Status: DC
  Administered 2014-03-10 – 2014-03-12 (×3): 40 mg via ORAL
  Filled 2014-03-09 (×4): qty 1

## 2014-03-09 MED ORDER — TRAMADOL HCL 50 MG PO TABS
100.0000 mg | ORAL_TABLET | Freq: Four times a day (QID) | ORAL | Status: DC | PRN
Start: 2014-03-09 — End: 2014-03-14

## 2014-03-09 MED ORDER — SUCRALFATE 1 GM/10ML PO SUSP
1.0000 g | Freq: Three times a day (TID) | ORAL | Status: DC
  Administered 2014-03-09 – 2014-03-14 (×18): 1 g via ORAL
  Filled 2014-03-09 (×24): qty 10

## 2014-03-09 MED ORDER — INSULIN ASPART 100 UNIT/ML ~~LOC~~ SOLN
0.0000 [IU] | Freq: Three times a day (TID) | SUBCUTANEOUS | Status: DC
  Administered 2014-03-11 (×2): 3 [IU] via SUBCUTANEOUS
  Administered 2014-03-12 (×2): 2 [IU] via SUBCUTANEOUS
  Administered 2014-03-12: 3 [IU] via SUBCUTANEOUS
  Administered 2014-03-13: 2 [IU] via SUBCUTANEOUS
  Administered 2014-03-13: 1 [IU] via SUBCUTANEOUS
  Administered 2014-03-14: 2 [IU] via SUBCUTANEOUS

## 2014-03-09 MED ORDER — GABAPENTIN 300 MG PO CAPS
300.0000 mg | ORAL_CAPSULE | Freq: Every day | ORAL | Status: DC
  Administered 2014-03-09 – 2014-03-13 (×5): 300 mg via ORAL
  Filled 2014-03-09 (×8): qty 1

## 2014-03-09 MED ORDER — ADULT MULTIVITAMIN W/MINERALS CH
1.0000 | ORAL_TABLET | Freq: Every day | ORAL | Status: DC
  Administered 2014-03-10 – 2014-03-14 (×5): 1 via ORAL
  Filled 2014-03-09 (×5): qty 1
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
## 2014-03-09 NOTE — Progress Notes (Signed)
Called ED for report  

## 2014-03-09 NOTE — ED Notes (Signed)
[facility] and family requested sent here for further eval. Pt is alert and oriented at times. Answers questions appropriately but does have some moments of disorientation. Pt. Reporting right hip pain and states that is why she is falling.

## 2014-03-09 NOTE — ED Notes (Signed)
Admitting MD at bedside.
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
## 2014-03-09 NOTE — ED Notes (Signed)
[facility]: [phone]
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
## 2014-03-10 ENCOUNTER — Inpatient Hospital Stay (HOSPITAL_COMMUNITY): Payer: Medicare Other

## 2014-03-10 DIAGNOSIS — I350 Nonrheumatic aortic (valve) stenosis: Secondary | ICD-10-CM

## 2014-03-10 DIAGNOSIS — R410 Disorientation, unspecified: Secondary | ICD-10-CM

## 2014-03-10 DIAGNOSIS — I214 Non-ST elevation (NSTEMI) myocardial infarction: Secondary | ICD-10-CM | POA: Insufficient documentation

## 2014-03-10 LAB — BASIC METABOLIC PANEL
Anion gap: 9 (ref 5–15)
BUN: 16 mg/dL (ref 6–23)
CO2: 23 mmol/L (ref 19–32)
Calcium: 8.3 mg/dL — ABNORMAL LOW (ref 8.4–10.5)
Chloride: 109 mEq/L (ref 96–112)
Creatinine, Ser: 0.85 mg/dL (ref 0.50–1.10)
GFR, EST AFRICAN AMERICAN: 73 mL/min — AB (ref >90)
GFR, EST NON AFRICAN AMERICAN: 63 mL/min — AB (ref >90)
Glucose, Bld: 142 mg/dL — ABNORMAL HIGH (ref 70–99)
Potassium: 3.8 mmol/L (ref 3.5–5.1)
Sodium: 141 mmol/L (ref 135–145)

## 2014-03-10 LAB — GLUCOSE, CAPILLARY
GLUCOSE-CAPILLARY: 153 mg/dL — AB (ref 70–99)
Glucose-Capillary: 110 mg/dL — ABNORMAL HIGH (ref 70–99)
Glucose-Capillary: 93 mg/dL (ref 70–99)
Glucose-Capillary: 100 mg/dL — ABNORMAL HIGH (ref 70–99)

## 2014-03-10 LAB — CBC
HCT: 29.7 % — ABNORMAL LOW (ref 36.0–46.0)
HEMOGLOBIN: 9.7 g/dL — AB (ref 12.0–15.0)
MCH: 30.3 pg (ref 26.0–34.0)
MCHC: 32.7 g/dL (ref 30.0–36.0)
MCV: 92.8 fL (ref 78.0–100.0)
PLATELETS: 186 10*3/uL (ref 150–400)
RBC: 3.2 MIL/uL — AB (ref 3.87–5.11)
RDW: 16.7 % — AB (ref 11.5–15.5)
WBC: 6.4 10*3/uL (ref 4.0–10.5)

## 2014-03-10 LAB — TROPONIN I
TROPONIN I: 0.19 ng/mL — AB (ref <0.031)
TROPONIN I: 0.1 ng/mL — AB (ref <0.031)
TROPONIN I: 0.15 ng/mL — AB (ref <0.031)

## 2014-03-10 LAB — INFLUENZA PANEL BY PCR (TYPE A & B)
H1N1 flu by pcr: NOT DETECTED
INFLAPCR: NEGATIVE
Influenza B By PCR: NEGATIVE

## 2014-03-10 LAB — MRSA PCR SCREENING: MRSA BY PCR: NEGATIVE

## 2014-03-10 LAB — MAGNESIUM: MAGNESIUM: 1.4 mg/dL — AB (ref 1.5–2.5)

## 2014-03-10 MED ORDER — IOHEXOL 300 MG/ML  SOLN
25.0000 mL | INTRAMUSCULAR | Status: AC
  Administered 2014-03-10 (×2): 25 mL via ORAL

## 2014-03-10 MED ORDER — ZOLPIDEM TARTRATE 5 MG PO TABS
5.0000 mg | ORAL_TABLET | Freq: Once | ORAL | Status: AC
  Administered 2014-03-10: 5 mg via ORAL
  Filled 2014-03-10: qty 1

## 2014-03-10 MED ORDER — IOHEXOL 300 MG/ML  SOLN
100.0000 mL | Freq: Once | INTRAMUSCULAR | Status: AC | PRN
  Administered 2014-03-10: 100 mL via INTRAVENOUS

## 2014-03-10 MED ORDER — MAGNESIUM SULFATE IN D5W 10-5 MG/ML-% IV SOLN
1.0000 g | Freq: Once | INTRAVENOUS | Status: AC
  Administered 2014-03-10: 1 g via INTRAVENOUS
  Filled 2014-03-10: qty 100

## 2014-03-10 NOTE — Progress Notes (Signed)
Pt is back to floor from CT. Pt resting. Pt's family at bedside. Pt states no complaints.

## 2014-03-10 NOTE — Progress Notes (Addendum)
PT Cancellation Note  Patient Details Name: [name] BowensMary May MRN: [id number] DOB: 21-Feb-1933   @11 :26 am Cancelled Treatment:    Reason Eval/Treat Not Completed: Other (comment) (pt on bedrest and PT ordered on the same day.)  Paged MD to see if bedrest is still appropriate.  PT to check back later today or tomorrow to see if bedrest is lifted and we can safely mobilize pt and complete evaluation.  @16 :00- checked orders again and pt still on bedrest.    Thanks,    Lurena Joinerebecca B. [place], PT, DPT [phone]   03/10/2014, 11:26 AM

## 2014-03-10 NOTE — Progress Notes (Signed)
Pt is resting with no complaints. Pt is alert and oriented to place, self and situation. Pt has orders to go to have CT of abdomen and pelvis. Dr. Gonzella Lexhungel called and stated ok for pt to go without nurse. Pt drank all of her contrast. Transport is on way.
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
## 2014-03-10 NOTE — Progress Notes (Signed)
Notified Dr. Gonzella Lexhungel that telemetry called and pt had a EKG strip showing 12 beats of Accelerated Idioventricular Rhythm and now rhythm is back to brady with a rate between 40's-50's. Pt is asymptomatic. Also pt stating she is hungry. Requesting a diet. MD placing orders.

## 2014-03-10 NOTE — Progress Notes (Signed)
Attempted to call report to 5 west and nurse unavailable
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
## 2014-03-11 ENCOUNTER — Encounter (HOSPITAL_COMMUNITY): Payer: Self-pay

## 2014-03-11 DIAGNOSIS — I359 Nonrheumatic aortic valve disorder, unspecified: Secondary | ICD-10-CM

## 2014-03-11 DIAGNOSIS — W19XXXD Unspecified fall, subsequent encounter: Secondary | ICD-10-CM

## 2014-03-11 LAB — GLUCOSE, CAPILLARY
GLUCOSE-CAPILLARY: 138 mg/dL — AB (ref 70–99)
GLUCOSE-CAPILLARY: 220 mg/dL — AB (ref 70–99)
GLUCOSE-CAPILLARY: 150 mg/dL — AB (ref 70–99)
Glucose-Capillary: 222 mg/dL — ABNORMAL HIGH (ref 70–99)
Glucose-Capillary: 137 mg/dL — ABNORMAL HIGH (ref 70–99)

## 2014-03-11 LAB — MAGNESIUM: Magnesium: 1.7 mg/dL (ref 1.5–2.5)

## 2014-03-11 MED ORDER — INFLUENZA VAC SPLIT QUAD 0.5 ML IM SUSY
0.5000 mL | PREFILLED_SYRINGE | INTRAMUSCULAR | Status: DC
  Filled 2014-03-11: qty 0.5

## 2014-03-11 NOTE — Progress Notes (Signed)
Pt transferred per bed by RN staff Pt has all personal belongings. Family aware that pt is transferring.

## 2014-03-11 NOTE — Progress Notes (Signed)
Echocardiogram 2D Echocardiogram has been performed.  [name] May 03/11/2014, 2:59 PM
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
## 2014-03-11 NOTE — Clinical Social Work Psychosocial (Signed)
Clinical Social Work Department BRIEF PSYCHOSOCIAL ASSESSMENT 03/11/2014  Patient:  [name] May,[name] May     Account Number:  [id number]     Admit date:  03/09/2014  Clinical Social Worker:  Merlyn LotHOLOMAN,Luxe Cuadros, CLINICAL SOCIAL WORKER  Date/Time:  03/11/2014 04:15 PM  Referred by:  Physician  Date Referred:  03/11/2014 Referred for  Other - See comment   Other Referral:   consult for home health needs but sounds like patient needs SNF- CSW getting involved to help find appropriate disposition plan   Interview type:  Family Other interview type:    PSYCHOSOCIAL DATA Living Status:  FAMILY Admitted from facility:   Level of care:   Primary support name:  [name] May Primary support relationship to patient:  FAMILY Degree of support available:   high level of support- Toby has moved in with pt after [facility]one [facility]) to provide help and supervision    CURRENT CONCERNS Current Concerns  Post-Acute Placement   Other Concerns:    SOCIAL WORK ASSESSMENT / PLAN CSW spoke with patients grandson concerning patinet dispostion.  Patient is very set on returning home wiht home health with help and supervision of grandson. Patients grandson does not feel prepared to help pt fully due to his work schedule which leaves pt alone for about 4 hours every evening.  [name] May has been living with pt since recent DC from SNF but has family and home about 15 minutes away.  [name] May does not have additional family in the area to help care for pt and would prefer for her to return to SNF. [name] May is feeling overwhelmed by taking care of pt and has been told by the Drs that she will need hospice following soon because of heart condition.    CSW will continue to follow and speak with physician regarding safe DC plan.   Assessment/plan status:  Psychosocial Support/Ongoing Assessment of Needs Other assessment/ plan:   Information/referral to community resources:     PATIENT'S/FAMILY'S RESPONSE TO PLAN OF CARE: Patients grandson is not agreeable to patient return home with home health and would prefer for pt to go to SNF since grandson is unable to provide 24 hour supervision. Patients grandson also reported being saddened by the news that hospice at home is being recommended and is upset that his last relative that he has known throughout his life is declining.       Merlyn LotJenna Holoman, LCSWA Clinical Social Worker [phone]

## 2014-03-11 NOTE — Progress Notes (Signed)
[facility]ed [facility] to [phone] spoke with Education officer, communityTina RN. Pt will be coming by bed to room 12.
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
## 2014-03-11 NOTE — Progress Notes (Signed)
PT Cancellation Note  Patient Details Name: Isabella BowensMary Guizar MRN: [id number] DOB: 09-13-1932   Cancelled Treatment:    Reason Eval/Treat Not Completed: Other (comment) (pt on bedrest, will need updated activity orders for therapy to proceed)   Fabio AsaWerner, Murial Beam J 03/11/2014, 8:15 AM Charlotte Crumbevon Frankye Schwegel, PT DPT  [phone]

## 2014-03-12 DIAGNOSIS — R531 Weakness: Secondary | ICD-10-CM

## 2014-03-12 DIAGNOSIS — M25551 Pain in right hip: Secondary | ICD-10-CM

## 2014-03-12 DIAGNOSIS — Z515 Encounter for palliative care: Secondary | ICD-10-CM

## 2014-03-12 LAB — GLUCOSE, CAPILLARY
Glucose-Capillary: 153 mg/dL — ABNORMAL HIGH (ref 70–99)
Glucose-Capillary: 198 mg/dL — ABNORMAL HIGH (ref 70–99)
Glucose-Capillary: 167 mg/dL — ABNORMAL HIGH (ref 70–99)
Glucose-Capillary: 214 mg/dL — ABNORMAL HIGH (ref 70–99)

## 2014-03-12 MED ORDER — GUAIFENESIN ER 600 MG PO TB12
600.0000 mg | ORAL_TABLET | Freq: Two times a day (BID) | ORAL | Status: DC | PRN
  Administered 2014-03-12: 600 mg via ORAL
  Filled 2014-03-12 (×3): qty 1
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
## 2014-03-12 NOTE — Progress Notes (Addendum)
Inpatient Diabetes Program Recommendations  AACE/ADA: New Consensus Statement on Inpatient Glycemic Control (2013)  Target Ranges:  Prepandial:   less than 140 mg/dL      Peak postprandial:   less than 180 mg/dL (1-2 hours)      Critically ill patients:  140 - 180 mg/dL   Results for [name] May, Roxanna (MRN [id number]) as of 03/12/2014 08:46  Ref. Range 03/11/2014 08:40 03/11/2014 11:41 03/11/2014 16:05 03/11/2014 17:29 03/11/2014 21:28 03/12/2014 07:35  Glucose-Capillary Latest Range: 70-99 mg/dL 782138 (H) 956222 (H) 213220 (H) 137 (H) 150 (H) 153 (H)   Diabetes history: DM2 Outpatient Diabetes medications: Metformin 1000 mg BID Current orders for Inpatient glycemic control: Novolog 0-9 units TID with meals  Inpatient Diabetes Program Recommendations Correction (SSI): Please consider increasing Novolog correction to moderate scale and add bedtime correction. Diet: Please consider changing from Regular to Carb Modified diet.  Thanks, Orlando PennerMarie Kristalyn Bergstresser, RN, MSN, CCRN, CDE Diabetes Coordinator Inpatient Diabetes Program [phone] (Team Pager) [phone] ([facility] office) [phone] [facility] office)
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
## 2014-03-13 DIAGNOSIS — R5381 Other malaise: Secondary | ICD-10-CM

## 2014-03-13 DIAGNOSIS — E43 Unspecified severe protein-calorie malnutrition: Secondary | ICD-10-CM

## 2014-03-13 LAB — GLUCOSE, CAPILLARY
Glucose-Capillary: 132 mg/dL — ABNORMAL HIGH (ref 70–99)
Glucose-Capillary: 155 mg/dL — ABNORMAL HIGH (ref 70–99)
Glucose-Capillary: 183 mg/dL — ABNORMAL HIGH (ref 70–99)

## 2014-03-13 MED ORDER — SIMVASTATIN 40 MG PO TABS
40.0000 mg | ORAL_TABLET | Freq: Every day | ORAL | Status: DC
  Administered 2014-03-13: 40 mg via ORAL
  Filled 2014-03-13 (×2): qty 1
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
## 2014-03-13 NOTE — Clinical Social Work Note (Addendum)
CSW talked with patient and grandson, Glena Norfolkoby Anderson ([phone]) regarding discharge plans. MD recommending ST rehab and PT/OT recommending home with 24 hour care/supervision, and patient adamantly expressed the desire to go home at discharge. Grandson was in the room at time of CSW's visit and extensive conversation was had regarding the discharge recommendation and that he works and is not available to provide 24 hour care/supervision. [place] and did not have a good experience. Grandson very concerned about patient falling and reported that she has fallen about 4 times within a brief period of time prior to this hospital admission. He also cited an instance when patient got up in the middle of the night and put on her shoes to go outside. He expressed to patient his concern about her safety when he is at work. While talking with patient and grandson, attending MD came to room and also talked with patient about the importance of going to a facility for ST rehab and patient continued to express wanting to go home. After MD left conversation continued regarding rehab and preferred facilities and patient expressed again not wanting to go to facility she went to previously.   Grandson informed CSW later that he moved in with his grandmother after she discharged from the skilled facility in December 2015. Mr. Dareen Pianonderson reported that he is patient's only grandchild and that her 2 children (one of them his dad) are deceased. He informed CSW that patient is the oldest of 12 children and all of them are living and reside in various states throughout the country. Mr. Dareen Pianonderson became emotional and tearful when talking about his dad and wanting the best for his grandmother.  CSW provided support and empathetic listening to patient and grandson. Patient still does not want to go to a facility, but begin to express an understanding of why she needs to go. Patient  assured that she will not have to go to the facility she went to before and that the rehab is short-term.  CSW talked with patient late afternoon [facility]. Patient continues to adamantly refuse SNF. CSW contacted grandson to advise him and at his request he talked with his grandmother by phone and she continued to refuse SNF indicating that it did not help her when she went before, she lost weight and she does not want to go. CSW observed patient walking with PT and she walked independently with a walker. Physical therapist is recommending 24 hr care/supervisor. CSW contacted MD after talking with Mr. Anderson (g'son) and patient to update him and he will order a psych consult for capacity.   Genelle BalVanessa Keasha Malkiewicz, MSW, LCSW Licensed Clinical Social Worker Clinical Social Work Department [facility] [phone]
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
## 2014-03-13 NOTE — Clinical Social Work Placement (Addendum)
Clinical Social Work Department CLINICAL SOCIAL WORK PLACEMENT NOTE 03/13/2014  Patient:  [name],[name]  Account Number:  [id number] Admit date:  03/09/2014  Clinical Social Worker:  Genelle BalVANESSA CRAWFORD, LCSW  Date/time:  03/13/2014 01:48 AM  Clinical Social Work is seeking post-discharge placement for this patient at the following level of care:   SKILLED NURSING   (*CSW will update this form in Epic as items are completed)   03/13/2014  Patient/family provided with [facility] Department of Clinical Social Work's list of facilities offering this level of care within the geographic area requested by the patient (or if unable, by the patient's family).  03/13/2014  Patient/family informed of their freedom to choose among providers that offer the needed level of care, that participate in Medicare, Medicaid or managed care program needed by the patient, have an available bed and are willing to accept the patient.    Patient/family informed of [facility]' ownership interest in [facility], as well as of the fact that they are under no obligation to receive care at this facility.  PASARR submitted to EDS on 12/19/13  PASARR number received on 12/19/13 Transmitted to all facilities in geographic area requested by pt/family on  03/13/2014 FL2 transmitted to all facilities within larger geographic area on   Patient informed that his/her managed care company has contracts with or will negotiate with  certain facilities, including the following:     Patient/family informed of bed offers received:  03/14/2014 [facility] Physician recommends and patient chooses bed at    [facility] Patient to be transferred to facility by Ambulance Patient and family notified of transfer on 03/14/2014 Name of family member notified: Dickie Laoby (grandson) [phone]  The following physician request were entered in  Epic:   Additional Comments:   Clenton Esper CSW-Intern

## 2014-03-14 LAB — GLUCOSE, CAPILLARY
GLUCOSE-CAPILLARY: 223 mg/dL — AB (ref 70–99)
GLUCOSE-CAPILLARY: 134 mg/dL — AB (ref 70–99)
Glucose-Capillary: 162 mg/dL — ABNORMAL HIGH (ref 70–99)
Glucose-Capillary: 166 mg/dL — ABNORMAL HIGH (ref 70–99)

## 2014-03-14 NOTE — Progress Notes (Signed)
Progress Note from the Palliative Medicine Team at [facility]: I met with [name] May and her grandson at bedside. Their conversation is very heated when I enter room and they are arguing over her going home. [name] May has been adamant that she is to return home but her grandson, [name] May, says he is unable to be there 24/7 and there is no one else that can be there. He is there when he is not at work but she has had many falls. He is very concerned with her well being. [name] May is very upset but reluctantly agrees that she will go to rehab "to make everyone else happy, but I do NOT want to go." Very difficult situation. Discussed plan for hospice care after rehab with Toby.     Objective: Allergies  Allergen Reactions  . Morphine And Related    Scheduled Meds: . diclofenac sodium  4 g Topical BID  . gabapentin  300 mg Oral QHS  . Influenza vac split quadrivalent PF  0.5 mL Intramuscular Tomorrow-1000  . insulin aspart  0-9 Units Subcutaneous TID WC  . losartan  50 mg Oral Daily  . multivitamin with minerals  1 tablet Oral Daily  . pantoprazole  40 mg Oral Daily  . QUEtiapine  25 mg Oral QHS  . simvastatin  40 mg Oral q1800  . sodium chloride  3 mL Intravenous Q12H  . sucralfate  1 g Oral TID WC & HS   Continuous Infusions:  PRN Meds:.acetaminophen, guaiFENesin, haloperidol, nitroGLYCERIN, traMADol  BP 142/29 mmHg  Pulse 50  Temp(Src) 97.6 F (36.4 C) (Oral)  Resp 18  Ht _0  (1.651 m)  Wt 60.4 kg (133 lb 2.5 oz)  BMI 22.16 kg/m2  SpO2 100%   PPS: 40%   Intake/Output Summary (Last 24 hours) at 03/14/14 1238 Last data filed at 03/14/14 0946  Gross per 24 hour  Intake    610 ml  Output   1251 ml  Net   -641 ml       Physical Exam:  General: NAD, thin, frail HEENT: Left periorbital bruising s/p fall, moist mucous membranes Chest: CTA throughout, no labored breathing, symmetric CVS: Bradycardic, + murmur Abdomen: Soft, NT, ND Ext: MAE, no edema Neuro:  Awake, alert, oriented x 3   Labs: CBC    Component Value Date/Time   WBC 6.4 03/10/2014 0310   RBC 3.20* 03/10/2014 0310   HGB 9.7* 03/10/2014 0310   HCT 29.7* 03/10/2014 0310   PLT 186 03/10/2014 0310   MCV 92.8 03/10/2014 0310   MCH 30.3 03/10/2014 0310   MCHC 32.7 03/10/2014 0310   RDW 16.7* 03/10/2014 0310   LYMPHSABS 1.4 03/09/2014 1544   MONOABS 0.5 03/09/2014 1544   EOSABS 0.1 03/09/2014 1544   BASOSABS 0.0 03/09/2014 1544    BMET    Component Value Date/Time   NA 141 03/10/2014 0310   K 3.8 03/10/2014 0310   CL 109 03/10/2014 0310   CO2 23 03/10/2014 0310   GLUCOSE 142* 03/10/2014 0310   BUN 16 03/10/2014 0310   CREATININE 0.85 03/10/2014 0310   CALCIUM 8.3* 03/10/2014 0310   GFRNONAA 63* 03/10/2014 0310   GFRAA 73* 03/10/2014 0310    CMP     Component Value Date/Time   NA 141 03/10/2014 0310   K 3.8 03/10/2014 0310   CL 109 03/10/2014 0310   CO2 23 03/10/2014 0310   GLUCOSE 142* 03/10/2014 0310   BUN 16 03/10/2014 0310  CREATININE 0.85 03/10/2014 0310   CALCIUM 8.3* 03/10/2014 0310   PROT 5.7* 03/09/2014 1544   ALBUMIN 3.2* 03/09/2014 1544   AST 20 03/09/2014 1544   ALT 10 03/09/2014 1544   ALKPHOS 105 03/09/2014 1544   BILITOT 0.6 03/09/2014 1544   GFRNONAA 63* 03/10/2014 0310   GFRAA 73* 03/10/2014 0310    Assessment and Plan: 1. Code Status: DNR 2. Symptom Control: 1. Weakness: We discussed her weakness/dizziness is likely cardiac related and she does not wish for intervention.  2. Right hip/right knee pain: Continue voltaren gel. She does not like taking narcotics and says tylenol works well for her.  3. Productive Cough: Mucinex ordered BID prn. She complains of this but I only noted clear/frothy sputum.  3. Psycho/Social: Emotional support provided to patient and family at bedside.  4. Spiritual: Chaplain following.  5. Disposition: SNF rehab.     Time In Time Out Total Time Spent with Patient Total Overall Time  1110 1155 64mn  475m    Greater than 50%  of this time was spent counseling and coordinating care related to the above assessment and plan.  AlVinie SillNP Palliative Medicine Team Pager # [phone]-F 8a-5p) Team Phone # [phone]/Weekends)

## 2014-03-14 NOTE — Progress Notes (Signed)
Discharge education completed by RN.  IV removed, site is within normal limits. Pt will discharge from the unit via PTAR. Report called to S. Micolette LPN at [facility].

## 2014-03-14 NOTE — Care Management Note (Signed)
CARE MANAGEMENT NOTE 03/14/2014  Patient:  [name] May, [name] May   Account Number:  [id number]  Date Initiated:  03/12/2014  Documentation initiated by:  Graesyn Schreifels  Subjective/Objective Assessment:   CM following for progression and d/c planning.     Action/Plan:   03/12/2014 Met with pt re d/c needs, pt has used [facility] previouly and does not feel that she needs this now. PT eval will make recommendations.   Anticipated DC Date:  03/14/2014   Anticipated DC Plan:  Pax         Choice offered to / List presented to:          [facility] arranged  HH-1 RN  [place] agency  [place]   Status of service:   Medicare Important Message given?  YES (If response is "NO", the following Medicare IM given date fields will be blank) Date Medicare IM given:  03/12/2014 Medicare IM given by:  Yuvraj Pfeifer Date Additional Medicare IM given:   Additional Medicare IM given by:    Discharge Disposition:  [place]  Per UR Regulation:    If discussed at Long Length of Stay Meetings, dates discussed:    Comments:  03/14/2014 Met with pt who earlier had declined [facility] services however SNF was recommended for this pt and as we are unable to place her due to insurance issues, then [facility] services have beeen arranged with [facility] providing services as she has used that agency previously. Family informed.  CRoyal RN MPH, case manager, [phone]
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
## 2014-03-14 NOTE — Progress Notes (Signed)
°   03/14/14 1500  Clinical Encounter Type  Visited With Patient  Visit Type Follow-up  Referral From Nurse  Stress Factors  Patient Stress Factors Health changes  Advance Directives (For Healthcare)  Does patient have an advance directive? Yes  Type of Advance Directive Healthcare Power of Attorney  Does patient want to make changes to advanced directive? No - Patient declined  Copy of advanced directive(s) in chart? No - copy requested    Nurse asked if I could talk with pt.  I spoke with her a few days ago.  Pt was talking with a nurse about rehabilitation when I entered.  Nurse left and pt starting talking about not wanting to go to [facility] as she had been before and had a bad experience.  Pt said she is going to be discharged and sent to a rehab facility and it's not what she wants.  Pt is upset due to sores on her back and hip that have gone undressed and unattended by the nurses.  She said she has been here since Saturday and has requested her hair to washed and it hasn't been washed as of today.    She is stubborn, she says, because she has always done for herself and she wants to go home.    She had trouble recalling what we had spoken about on Tuesday and went over the same conversation again.  I asked her if she needed anything and she asked that I check on her discharge and bring her a cup of ice.  Lorna FewChristina Yarbrough, Chaplain Intern 03/14/2014, 3:46 PM

## 2014-03-16 LAB — CULTURE, BLOOD (ROUTINE X 2)
Culture: NO GROWTH
Culture: NO GROWTH

## 2014-06-23 DEATH — deceased

## 2016-07-23 IMAGING — CR DG CHEST 1V PORT
1 series · 1 of 1 positions shown · non-contrast
Comparison: None.

CLINICAL DATA: Trauma

EXAM:
PORTABLE CHEST - 1 VIEW

[AP]
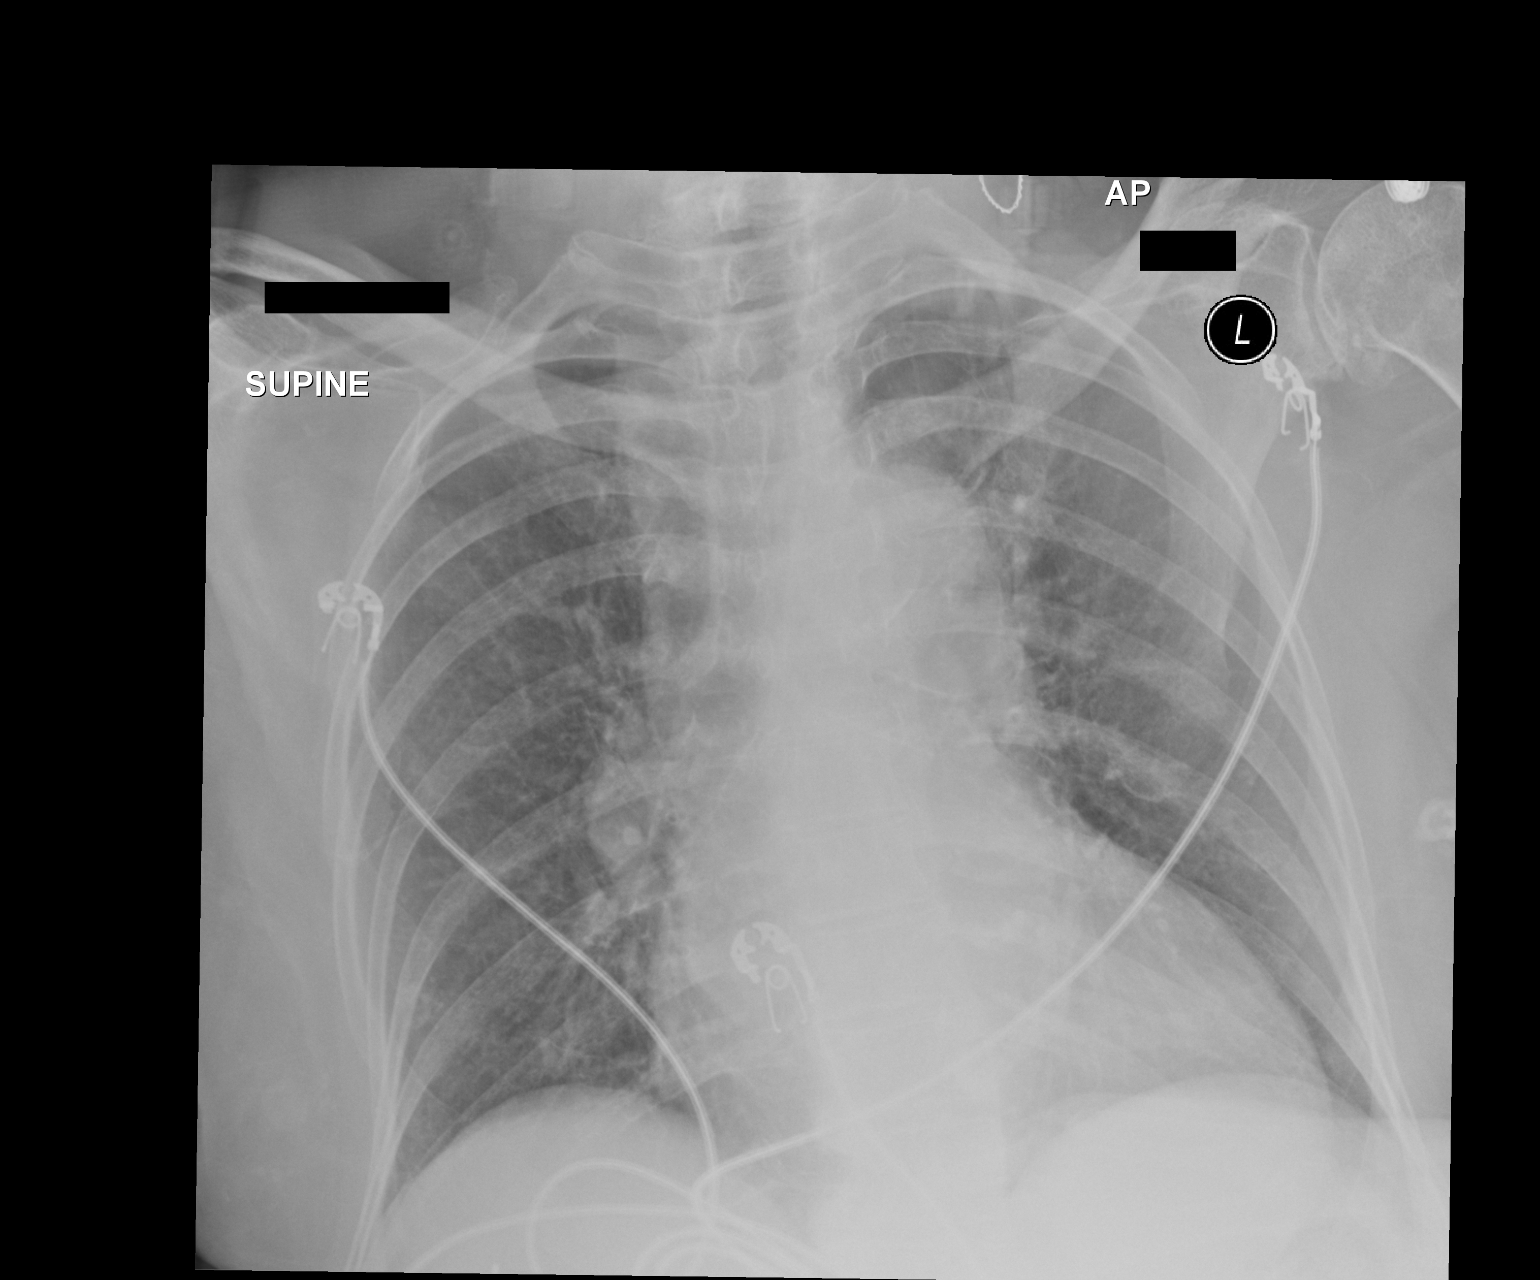

[1 of 1 positions shown; findings below may reference images not displayed]

FINDINGS: The heart size and mediastinal contours are within normal limits.
Both lungs are clear. The visualized skeletal structures are
unremarkable.
IMPRESSION: No active disease.
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
